# Patient Record
Sex: Male | Born: 1985
Health system: Southern US, Community
[De-identification: ages and names within clinical notes are randomized; demographics above are authoritative.]

## PROBLEM LIST (undated history)

## (undated) DIAGNOSIS — E785 Hyperlipidemia, unspecified: Secondary | ICD-10-CM

## (undated) DIAGNOSIS — I493 Ventricular premature depolarization: Principal | ICD-10-CM

## (undated) HISTORY — DX: Ventricular premature depolarization: I49.3

## (undated) HISTORY — DX: Hyperlipidemia, unspecified: E78.5

## (undated) HISTORY — PX: WISDOM TOOTH EXTRACTION: SHX21

---

## 2016-04-21 DIAGNOSIS — R0609 Other forms of dyspnea: Secondary | ICD-10-CM | POA: Diagnosis not present

## 2017-04-09 DIAGNOSIS — E663 Overweight: Secondary | ICD-10-CM | POA: Diagnosis not present

## 2017-04-09 DIAGNOSIS — E785 Hyperlipidemia, unspecified: Secondary | ICD-10-CM | POA: Diagnosis not present

## 2017-04-09 DIAGNOSIS — I493 Ventricular premature depolarization: Secondary | ICD-10-CM | POA: Diagnosis not present

## 2017-08-07 DIAGNOSIS — K529 Noninfective gastroenteritis and colitis, unspecified: Secondary | ICD-10-CM | POA: Diagnosis not present

## 2017-08-07 DIAGNOSIS — R197 Diarrhea, unspecified: Secondary | ICD-10-CM | POA: Diagnosis not present

## 2017-08-07 DIAGNOSIS — R112 Nausea with vomiting, unspecified: Secondary | ICD-10-CM | POA: Diagnosis not present

## 2018-04-14 ENCOUNTER — Telehealth: Payer: Self-pay | Admitting: Cardiology

## 2018-04-19 NOTE — Telephone Encounter (Signed)
done

## 2018-05-07 DIAGNOSIS — S39012A Strain of muscle, fascia and tendon of lower back, initial encounter: Secondary | ICD-10-CM | POA: Diagnosis not present

## 2018-05-11 DIAGNOSIS — S39012A Strain of muscle, fascia and tendon of lower back, initial encounter: Secondary | ICD-10-CM | POA: Diagnosis not present

## 2018-05-20 DIAGNOSIS — M5386 Other specified dorsopathies, lumbar region: Secondary | ICD-10-CM | POA: Diagnosis not present

## 2018-05-20 DIAGNOSIS — S39012A Strain of muscle, fascia and tendon of lower back, initial encounter: Secondary | ICD-10-CM | POA: Diagnosis not present

## 2018-05-24 DIAGNOSIS — S39012A Strain of muscle, fascia and tendon of lower back, initial encounter: Secondary | ICD-10-CM | POA: Diagnosis not present

## 2018-05-25 NOTE — Progress Notes (Signed)
Cardiology Office Note:    Date:  05/26/2018   ID:  Jeffery Boyd, DOB [birth year], MRN [id number]  PCP:  Othella Boyerilley, William S, MD  Cardiologist:  Norman HerrlichBrian Emry Barbato, MD    Referring MD: Othella Boyerilley, William S, MD    ASSESSMENT:    1. Ventricular premature depolarization    PLAN:    In order of problems listed above:  1. He continues to have symptomatic palpitation off and on beta-blocker but is concerning here as he has exercise intolerance and sensation of heart racing with physical activity at times these individuals if structurally normal heart can have catecholamine sensitive or exercise-induced ventricular tachycardia and we will set up a stress echocardiogram also with his abnormal EKG with right axis deviation to assess for structural heart disease.  If he has exercise-induced ventricular tachycardia would benefit from referral to EP and consideration of ablation of VT focus.  Continue beta-blocker given instructions avoid over-the-counter proarrhythmic drugs.   Next appointment: 6 months   Medication Adjustments/Labs and Tests Ordered: Current medicines are reviewed at length with the patient today.  Concerns regarding medicines are outlined above.  Orders Placed This Encounter  Procedures  . EKG 12-Lead  . ECHOCARDIOGRAM STRESS TEST   Meds ordered this encounter  Medications  . atenolol (TENORMIN) 25 MG tablet    Sig: Take 1 tablet (25 mg total) by mouth daily.    Dispense:  90 tablet    Refill:  3    Chief Complaint  Patient presents with  . Follow-up    for PVC's    History of Present Illness:    Jeffery Boyd is a 32 y.o. male with a hx of exercise intolerance palpitation rapid heart rhythm and symptomatic PVCs last seen 6 months ago Dr. Donnie Ahoilley. Compliance with diet, lifestyle and medications: Yes  Off-and-on he stops his beta-blocker he had a flare of symptoms resumed it a few months ago still has occasional typical PVCs because forceful contraction but when he tries  to do sports his heart races away he has to stop his physical effort at times he feels like he may faint.  He is at risk for catecholamine sensitive or exercise-induced ventricular tachycardia and his EKG shows right axis deviation I asked him to set up a stress echo to assess for structural heart disease and exercise-induced arrhythmia will continue his beta-blocker for now. Past Medical History:  Diagnosis Date  . Hyperlipidemia 05/26/2018  . Ventricular premature depolarization 05/26/2018    Past Surgical History:  Procedure Laterality Date  . WISDOM TOOTH EXTRACTION      Current Medications: No outpatient medications have been marked as taking for the 05/26/18 encounter (Office Visit) with Baldo DaubMunley, Jone Panebianco J, MD.     Allergies:   Patient has no known allergies.   Social History   Socioeconomic History  . Marital status: Married    Spouse name: Not on file  . Number of children: Not on file  . Years of education: Not on file  . Highest education level: Not on file  Occupational History  . Not on file  Social Needs  . Financial resource strain: Not on file  . Food insecurity:    Worry: Not on file    Inability: Not on file  . Transportation needs:    Medical: Not on file    Non-medical: Not on file  Tobacco Use  . Smoking status: Never Smoker  . Smokeless tobacco: Never Used  Substance and Sexual Activity  . Alcohol use: Yes  Comment: occ  . Drug use: Not Currently  . Sexual activity: Not on file  Lifestyle  . Physical activity:    Days per week: Not on file    Minutes per session: Not on file  . Stress: Not on file  Relationships  . Social connections:    Talks on phone: Not on file    Gets together: Not on file    Attends religious service: Not on file    Active member of club or organization: Not on file    Attends meetings of clubs or organizations: Not on file    Relationship status: Not on file  Other Topics Concern  . Not on file  Social History  Narrative  . Not on file     Family History: The patient's family history includes Heart attack (age of onset: 49) in his paternal uncle; Hyperlipidemia in his father; Hypertension in his father. ROS:   Please see the history of present illness.    All other systems reviewed and are negative.  EKGs/Labs/Other Studies Reviewed:    The following studies were reviewed today:  EKG:  EKG ordered today.  The ekg ordered today demonstrates [facility] and RAD  Recent Labs: No results found for requested labs within last 8760 hours.  Recent Lipid Panel No results found for: CHOL, TRIG, HDL, CHOLHDL, VLDL, LDLCALC, LDLDIRECT  Physical Exam:    VS:  BP 102/66 (BP Location: Left Arm, Patient Position: Sitting, Cuff Size: Normal)   Pulse (!) 54   Ht 5' 6.75" (1.695 m)   Wt 178 lb 1.9 oz (80.8 kg)   SpO2 98%   BMI 28.11 kg/m     Wt Readings from Last 3 Encounters:  05/26/18 178 lb 1.9 oz (80.8 kg)     GEN:  Well nourished, well developed in no acute distress HEENT: Normal NECK: No JVD; No carotid bruits LYMPHATICS: No lymphadenopathy CARDIAC: RRR, no murmurs, rubs, gallops RESPIRATORY:  Clear to auscultation without rales, wheezing or rhonchi  ABDOMEN: Soft, non-tender, non-distended MUSCULOSKELETAL:  No edema; No deformity  SKIN: Warm and dry NEUROLOGIC:  Alert and oriented x 3 PSYCHIATRIC:  Normal affect    Signed, Norman Herrlich, MD  05/26/2018 5:03 PM    [facility]

## 2018-05-26 ENCOUNTER — Ambulatory Visit (INDEPENDENT_AMBULATORY_CARE_PROVIDER_SITE_OTHER): Payer: BLUE CROSS/BLUE SHIELD | Admitting: Cardiology

## 2018-05-26 VITALS — BP 102/66 | HR 54 | Ht 66.75 in | Wt 178.1 lb

## 2018-05-26 DIAGNOSIS — E785 Hyperlipidemia, unspecified: Secondary | ICD-10-CM | POA: Insufficient documentation

## 2018-05-26 DIAGNOSIS — I493 Ventricular premature depolarization: Secondary | ICD-10-CM | POA: Insufficient documentation

## 2018-05-26 HISTORY — DX: Hyperlipidemia, unspecified: E78.5

## 2018-05-26 HISTORY — DX: Ventricular premature depolarization: I49.3

## 2018-05-26 MED ORDER — ATENOLOL 25 MG PO TABS: 25.0000 mg | ORAL_TABLET | Freq: Every day | ORAL | 3 refills | Status: DC

## 2018-05-26 NOTE — Patient Instructions (Addendum)
Medication Instructions:  Your physician recommends that you continue on your current medications as directed. Please refer to the Current Medication list given to you today.  If you need a refill on your cardiac medications before your next appointment, please call your pharmacy.   Lab work: None  If you have labs (blood work) drawn today and your tests are completely normal, you will receive your results only by: Jeffery Boyd. MyChart Message (if you have MyChart) OR . A paper copy in the mail If you have any lab test that is abnormal or we need to change your treatment, we will call you to review the results.  Testing/Procedures: You had an EKG today.   Your physician has requested that you have a stress echocardiogram. For further information please visit [email or web address]. Please follow instruction sheet as given.  Follow-Up: At [facility], you and your health needs are our priority.  As part of our continuing mission to provide you with exceptional heart care, we have created designated Provider Care Teams.  These Care Teams include your primary Cardiologist (physician) and Advanced Practice Providers (APPs -  Physician Assistants and Nurse Practitioners) who all work together to provide you with the care you need, when you need it. You will need a follow up appointment in 1 years.  Please call our office 2 months in advance to schedule this appointment.       1. Avoid all over-the-counter antihistamines except Claritin/Loratadine and Zyrtec/Cetrizine. 2. Avoid all combination including cold sinus allergies flu decongestant and sleep medications 3. You can use Robitussin DM Mucinex and Mucinex DM for cough. 4. can use Tylenol aspirin ibuprofen and naproxen but no combinations such as sleep or sinus.   KardiaMobile [email or web address]        FDA-cleared, clinical grade mobile EKG monitor: Lourena SimmondsKardia is the most clinically-validated mobile EKG used by the  world's leading cardiac care medical professionals With Basic service, know instantly if your heart rhythm is normal or if atrial fibrillation is detected, and email the last single EKG recording to yourself or your doctor Premium service, available for purchase through the Kardia app for $9.99 per month or $99 per year, includes unlimited history and storage of your EKG recordings, a monthly EKG summary report to share with your doctor, along with the ability to track your blood pressure, activity and weight Includes one KardiaMobile phone clip FREE SHIPPING: Standard delivery 1-3 business days. Orders placed by 11:00am PST will ship that afternoon. Otherwise, will ship next business day. All orders ship via [employer] Priority Mail from [place], [place]      Exercise Stress Electrocardiogram An exercise stress electrocardiogram is a test to check how blood flows to your heart. It is done to find areas of poor blood flow. You will need to walk on a treadmill for this test. The electrocardiogram will record your heartbeat when you are at rest and when you are exercising. What happens before the procedure?  Do not have drinks with caffeine or foods with caffeine for 24 hours before the test, or as told by your doctor. This includes coffee, tea (even decaf tea), sodas, chocolate, and cocoa.  Follow your doctor's instructions about eating and drinking before the test.  Ask your doctor what medicines you should or should not take before the test. Take your medicines with water unless told by your doctor not to.  If you use an inhaler, bring it with you to the test.  Bring a snack to eat after the test.  Do not  smoke for 4 hours before the test.  Do not put lotions, powders, creams, or oils on your chest before the test.  Wear comfortable shoes and clothing. What happens during the procedure?  You will have patches put on your chest. Small areas of your chest may need to be shaved. Wires will be  connected to the patches.  Your heart rate will be watched while you are resting and while you are exercising.  You will walk on the treadmill. The treadmill will slowly get faster to raise your heart rate.  The test will take about 1-2 hours. What happens after the procedure?  Your heart rate and blood pressure will be watched after the test.  You may return to your normal diet, activities, and medicines or as told by your doctor. This information is not intended to replace advice given to you by your health care provider. Make sure you discuss any questions you have with your health care provider. Document Released: 11/19/2007 Document Revised: 01/30/2016 Document Reviewed: 02/07/2013 Elsevier Interactive Patient Education  [employer].

## 2018-05-27 DIAGNOSIS — S39012D Strain of muscle, fascia and tendon of lower back, subsequent encounter: Secondary | ICD-10-CM | POA: Diagnosis not present

## 2018-05-27 DIAGNOSIS — M5386 Other specified dorsopathies, lumbar region: Secondary | ICD-10-CM | POA: Diagnosis not present

## 2018-05-28 DIAGNOSIS — S39012A Strain of muscle, fascia and tendon of lower back, initial encounter: Secondary | ICD-10-CM | POA: Diagnosis not present

## 2018-05-28 DIAGNOSIS — M5386 Other specified dorsopathies, lumbar region: Secondary | ICD-10-CM | POA: Diagnosis not present

## 2018-05-31 DIAGNOSIS — M5386 Other specified dorsopathies, lumbar region: Secondary | ICD-10-CM | POA: Diagnosis not present

## 2018-05-31 DIAGNOSIS — S39012A Strain of muscle, fascia and tendon of lower back, initial encounter: Secondary | ICD-10-CM | POA: Diagnosis not present

## 2018-06-02 ENCOUNTER — Ambulatory Visit: Payer: BLUE CROSS/BLUE SHIELD | Admitting: Family Medicine

## 2018-06-02 ENCOUNTER — Encounter: Payer: Self-pay | Admitting: Family Medicine

## 2018-06-02 VITALS — BP 112/64 | HR 80 | Temp 97.7°F | Ht 66.0 in | Wt 174.5 lb

## 2018-06-02 DIAGNOSIS — Z0000 Encounter for general adult medical examination without abnormal findings: Secondary | ICD-10-CM

## 2018-06-02 DIAGNOSIS — Z23 Encounter for immunization: Secondary | ICD-10-CM

## 2018-06-02 NOTE — Patient Instructions (Addendum)
Give us 2-3 business days to get the results of your labs back.   Keep the diet clean and stay active.  Aim to do some physical exertion for 150 minutes per week. This is typically divided into 5 days per week, 30 minutes per day. The activity should be enough to get your heart rate up. Anything is better than nothing if you have time constraints.  Do monthly self testicular checks in the shower. You are feeling for lumps/bumps that don't belong. If you feel anything like this, let me know!  Let us know if you need anything. 

## 2018-06-02 NOTE — Addendum Note (Signed)
Addended by: Scharlene GlossEWING, Tineka Uriegas B on: 06/02/2018 04:02 PM   Modules accepted: Orders

## 2018-06-02 NOTE — Progress Notes (Signed)
Chief Complaint  Patient presents with  . New Patient (Initial Visit)    [name] is here for a complete physical.   His last physical was >1 year ago.  Current diet: in general, a "fair, could be better" diet.   Current exercise: tries to walk Weight trend: stable overall Daytime fatigue? No. Seat belt? Yes.    Health maintenance Tetanus- Yes 12/15/2011 HIV- Yes 2005  Past Medical History:  Diagnosis Date  . Hyperlipidemia 05/26/2018  . Ventricular premature depolarization 05/26/2018   On Atenolol     Past Surgical History:  Procedure Laterality Date  . WISDOM TOOTH EXTRACTION     Medications  Current Outpatient Medications on File Prior to Visit  Medication Sig Dispense Refill  . atenolol (TENORMIN) 25 MG tablet Take 1 tablet (25 mg total) by mouth daily. 90 tablet 3   Allergies No Known Allergies  Family History Family History  Problem Relation Age of Onset  . Hypertension Father   . Hyperlipidemia Father   . Heart attack Paternal Uncle 40  . Asthma Mother     Review of Systems: Constitutional: no fevers or chills Eye:  no recent significant change in vision Ear/Nose/Mouth/Throat:  Ears:  no tinnitus or hearing loss Nose/Mouth/Throat:  no complaints of nasal congestion, no sore throat Cardiovascular:  no chest pain, no palpitations Respiratory:  no cough and no shortness of breath Gastrointestinal:  no abdominal pain, no change in bowel habits GU:  Male: negative for dysuria, frequency, and incontinence and negative for prostate symptoms Musculoskeletal/Extremities: +LBP; otherwise no pain, redness, or swelling of the joints Integumentary (Skin/Breast):  no abnormal skin lesions reported Neurologic:  no headaches, no numbness, tingling Endocrine: No unexpected weight changes Hematologic/Lymphatic:  no night sweats  Exam BP 112/64 (BP Location: Left Arm, Patient Position: Sitting, Cuff Size: Normal)   Pulse 80   Temp 97.7 F (36.5 C)  (Oral)   Ht 5\' 6"  (1.676 m)   Wt 174 lb 8 oz (79.2 kg)   SpO2 98%   BMI 28.17 kg/m  General:  well developed, well nourished, in no apparent distress Skin:  no significant moles, warts, or growths Head:  no masses, lesions, or tenderness Eyes:  pupils equal and round, sclera anicteric without injection Ears:  canals without lesions, TMs shiny without retraction, no obvious effusion, no erythema Nose:  nares patent, septum midline, mucosa normal Throat/Pharynx:  lips and gingiva without lesion; tongue and uvula midline; non-inflamed pharynx; no exudates or postnasal drainage Neck: neck supple without adenopathy, thyromegaly, or masses Lungs:  clear to auscultation, breath sounds equal bilaterally, no respiratory distress Cardio:  regular rate and rhythm, no bruits, no LE edema Abdomen:  abdomen soft, nontender; bowel sounds normal; no masses or organomegaly Genital (male): Uncircumcised penis, no lesions or discharge; testes present bilaterally without masses or tenderness Rectal: Deferred Musculoskeletal:  symmetrical muscle groups noted without atrophy or deformity Extremities:  no clubbing, cyanosis, or edema, no deformities, no skin discoloration Neuro:  gait normal; deep tendon reflexes normal and symmetric Psych: well oriented with normal range of affect and appropriate judgment/insight  Assessment and Plan  Well adult exam - Plan: Comprehensive metabolic panel, Lipid panel   Well 32 y.o. male. Counseled on diet and exercise. Other orders as above. Follow up in 1 year pending the above workup. The patient voiced understanding and agreement to the plan.  Jeffery Rocheicholas Paul McGovernWendling, DO 06/02/18 3:24 PM

## 2018-06-02 NOTE — Progress Notes (Signed)
Pre visit review using our clinic review tool, if applicable. No additional management support is needed unless otherwise documented below in the visit note. 

## 2018-06-03 DIAGNOSIS — M5386 Other specified dorsopathies, lumbar region: Secondary | ICD-10-CM | POA: Diagnosis not present

## 2018-06-03 DIAGNOSIS — S39012D Strain of muscle, fascia and tendon of lower back, subsequent encounter: Secondary | ICD-10-CM | POA: Diagnosis not present

## 2018-06-07 ENCOUNTER — Ambulatory Visit (HOSPITAL_BASED_OUTPATIENT_CLINIC_OR_DEPARTMENT_OTHER)
Admission: RE | Admit: 2018-06-07 | Discharge: 2018-06-07 | Disposition: A | Discharge status: Home / Self Care | Payer: BLUE CROSS/BLUE SHIELD | Source: Ambulatory Visit | Attending: Cardiology | Admitting: Cardiology

## 2018-06-07 ENCOUNTER — Other Ambulatory Visit: Payer: Self-pay | Admitting: Family Medicine

## 2018-06-07 ENCOUNTER — Other Ambulatory Visit (INDEPENDENT_AMBULATORY_CARE_PROVIDER_SITE_OTHER): Payer: BLUE CROSS/BLUE SHIELD

## 2018-06-07 DIAGNOSIS — I493 Ventricular premature depolarization: Secondary | ICD-10-CM | POA: Diagnosis not present

## 2018-06-07 DIAGNOSIS — Z0000 Encounter for general adult medical examination without abnormal findings: Secondary | ICD-10-CM | POA: Diagnosis not present

## 2018-06-07 DIAGNOSIS — S39012A Strain of muscle, fascia and tendon of lower back, initial encounter: Secondary | ICD-10-CM | POA: Diagnosis not present

## 2018-06-07 DIAGNOSIS — E7849 Other hyperlipidemia: Secondary | ICD-10-CM

## 2018-06-07 DIAGNOSIS — M5386 Other specified dorsopathies, lumbar region: Secondary | ICD-10-CM | POA: Diagnosis not present

## 2018-06-07 LAB — COMPREHENSIVE METABOLIC PANEL
ALT: 18 U/L (ref 0–53)
AST: 17 U/L (ref 0–37)
Albumin: 4.5 g/dL (ref 3.5–5.2)
Alkaline Phosphatase: 45 U/L (ref 39–117)
BUN: 17 mg/dL (ref 6–23)
CO2: 30 mEq/L (ref 19–32)
Calcium: 9.8 mg/dL (ref 8.4–10.5)
Chloride: 102 mEq/L (ref 96–112)
Creatinine, Ser: 1.35 mg/dL (ref 0.40–1.50)
GFR: 64.92 mL/min (ref >60.00)
Glucose, Bld: 86 mg/dL (ref 70–99)
Potassium: 4.1 mEq/L (ref 3.5–5.1)
Sodium: 138 mEq/L (ref 135–145)
Total Bilirubin: 0.6 mg/dL (ref 0.2–1.2)
Total Protein: 6.5 g/dL (ref 6.0–8.3)

## 2018-06-07 LAB — LIPID PANEL
Cholesterol: 245 mg/dL — ABNORMAL HIGH (ref 0–200)
HDL: 45.4 mg/dL (ref >39.00)
LDL Cholesterol: 179 mg/dL — ABNORMAL HIGH (ref 0–99)
NonHDL: 199.62
Total CHOL/HDL Ratio: 5
Triglycerides: 101 mg/dL (ref 0.0–149.0)
VLDL: 20.2 mg/dL (ref 0.0–40.0)

## 2018-06-07 NOTE — Progress Notes (Signed)
  Echocardiogram Echocardiogram Stress Test has been performed.  Alcides Nutting T Girl Schissler 06/07/2018, 11:04 AM

## 2018-06-10 DIAGNOSIS — S39012D Strain of muscle, fascia and tendon of lower back, subsequent encounter: Secondary | ICD-10-CM | POA: Diagnosis not present

## 2018-06-15 DIAGNOSIS — S39012D Strain of muscle, fascia and tendon of lower back, subsequent encounter: Secondary | ICD-10-CM | POA: Diagnosis not present

## 2018-06-17 DIAGNOSIS — S39012A Strain of muscle, fascia and tendon of lower back, initial encounter: Secondary | ICD-10-CM | POA: Diagnosis not present

## 2018-06-22 DIAGNOSIS — S39012D Strain of muscle, fascia and tendon of lower back, subsequent encounter: Secondary | ICD-10-CM | POA: Diagnosis not present

## 2018-06-28 DIAGNOSIS — M5386 Other specified dorsopathies, lumbar region: Secondary | ICD-10-CM | POA: Diagnosis not present

## 2018-06-28 DIAGNOSIS — S39012D Strain of muscle, fascia and tendon of lower back, subsequent encounter: Secondary | ICD-10-CM | POA: Diagnosis not present

## 2018-07-06 DIAGNOSIS — M5386 Other specified dorsopathies, lumbar region: Secondary | ICD-10-CM | POA: Diagnosis not present

## 2018-07-06 DIAGNOSIS — S39012A Strain of muscle, fascia and tendon of lower back, initial encounter: Secondary | ICD-10-CM | POA: Diagnosis not present

## 2018-07-13 DIAGNOSIS — S39012D Strain of muscle, fascia and tendon of lower back, subsequent encounter: Secondary | ICD-10-CM | POA: Diagnosis not present

## 2018-07-22 DIAGNOSIS — S39012D Strain of muscle, fascia and tendon of lower back, subsequent encounter: Secondary | ICD-10-CM | POA: Diagnosis not present

## 2018-08-05 DIAGNOSIS — S39012D Strain of muscle, fascia and tendon of lower back, subsequent encounter: Secondary | ICD-10-CM | POA: Diagnosis not present

## 2018-09-06 ENCOUNTER — Other Ambulatory Visit: Payer: BLUE CROSS/BLUE SHIELD

## 2019-06-07 ENCOUNTER — Other Ambulatory Visit: Payer: Self-pay

## 2019-06-08 ENCOUNTER — Ambulatory Visit (INDEPENDENT_AMBULATORY_CARE_PROVIDER_SITE_OTHER): Payer: BC Managed Care – PPO | Admitting: Family Medicine

## 2019-06-08 ENCOUNTER — Encounter: Payer: Self-pay | Admitting: Family Medicine

## 2019-06-08 ENCOUNTER — Other Ambulatory Visit: Payer: Self-pay | Admitting: Family Medicine

## 2019-06-08 ENCOUNTER — Other Ambulatory Visit: Payer: Self-pay

## 2019-06-08 VITALS — BP 102/62 | HR 73 | Temp 96.4°F | Ht 66.5 in | Wt 166.0 lb

## 2019-06-08 DIAGNOSIS — E7849 Other hyperlipidemia: Secondary | ICD-10-CM

## 2019-06-08 DIAGNOSIS — Z0000 Encounter for general adult medical examination without abnormal findings: Secondary | ICD-10-CM

## 2019-06-08 LAB — LIPID PANEL
Cholesterol: 227 mg/dL — ABNORMAL HIGH (ref 0–200)
HDL: 47.2 mg/dL (ref >39.00)
LDL Cholesterol: 155 mg/dL — ABNORMAL HIGH (ref 0–99)
NonHDL: 179.66
Total CHOL/HDL Ratio: 5
Triglycerides: 123 mg/dL (ref 0.0–149.0)
VLDL: 24.6 mg/dL (ref 0.0–40.0)

## 2019-06-08 LAB — COMPREHENSIVE METABOLIC PANEL
ALT: 16 U/L (ref 0–53)
AST: 23 U/L (ref 0–37)
Albumin: 4.6 g/dL (ref 3.5–5.2)
Alkaline Phosphatase: 57 U/L (ref 39–117)
BUN: 18 mg/dL (ref 6–23)
CO2: 26 mEq/L (ref 19–32)
Calcium: 9.7 mg/dL (ref 8.4–10.5)
Chloride: 102 mEq/L (ref 96–112)
Creatinine, Ser: 1.22 mg/dL (ref 0.40–1.50)
GFR: 68.23 mL/min (ref >60.00)
Glucose, Bld: 89 mg/dL (ref 70–99)
Potassium: 3.6 mEq/L (ref 3.5–5.1)
Sodium: 138 mEq/L (ref 135–145)
Total Bilirubin: 0.5 mg/dL (ref 0.2–1.2)
Total Protein: 6.8 g/dL (ref 6.0–8.3)

## 2019-06-08 LAB — CBC
HCT: 44.2 % (ref 39.0–52.0)
Hemoglobin: 15.1 g/dL (ref 13.0–17.0)
MCHC: 34.2 g/dL (ref 30.0–36.0)
MCV: 87 fl (ref 78.0–100.0)
Platelets: 232 10*3/uL (ref 150.0–400.0)
RBC: 5.08 Mil/uL (ref 4.22–5.81)
RDW: 13 % (ref 11.5–15.5)
WBC: 6.7 10*3/uL (ref 4.0–10.5)

## 2019-06-08 NOTE — Patient Instructions (Addendum)
Give us 2-3 business days to get the results of your labs back.   Keep the diet clean and stay active.  Do monthly self testicular checks in the shower. You are feeling for lumps/bumps that don't belong. If you feel anything like this, let me know!  Let us know if you need anything. 

## 2019-06-08 NOTE — Progress Notes (Signed)
Chief Complaint  Patient presents with  . Annual Exam    [name] is here for a complete physical.   His last physical was >1 year ago.  Current diet: in general, a "healthy" diet.   Current exercise: running, in home workouts Weight trend: has intentionally lost Daytime fatigue? No. Seat belt? Yes.    Health maintenance Tetanus- Yes  HIV- Yes  Past Medical History:  Diagnosis Date  . Hyperlipidemia 05/26/2018  . Ventricular premature depolarization 05/26/2018   On Atenolol     Past Surgical History:  Procedure Laterality Date  . WISDOM TOOTH EXTRACTION     Medications  Current Outpatient Medications on File Prior to Visit  Medication Sig Dispense Refill  . atenolol (TENORMIN) 25 MG tablet Take 1 tablet (25 mg total) by mouth daily. 90 tablet 3   Allergies No Known Allergies  Family History Family History  Problem Relation Age of Onset  . Hypertension Father   . Hyperlipidemia Father   . Heart attack Paternal Uncle 31  . Asthma Mother     Review of Systems: Constitutional: no fevers or chills Eye:  no recent significant change in vision Ear/Nose/Mouth/Throat:  Ears:  no hearing loss Nose/Mouth/Throat:  no complaints of nasal congestion, no sore throat Cardiovascular:  no chest pain Respiratory:  no shortness of breath Gastrointestinal:  no abdominal pain, no change in bowel habits GU:  Male: negative for dysuria, frequency, and incontinence Musculoskeletal/Extremities:  no pain of the joints Integumentary (Skin/Breast):  no abnormal skin lesions reported Neurologic:  no headaches Endocrine: No unexpected weight changes Hematologic/Lymphatic:  no night sweats  Exam BP 102/62 (BP Location: Left Arm, Patient Position: Sitting, Cuff Size: Normal)   Pulse 73   Temp (!) 96.4 F (35.8 C) (Temporal)   Ht 5' 6.5" (1.689 m)   Wt 166 lb (75.3 kg)   SpO2 97%   BMI 26.39 kg/m  General:  well developed, well nourished, in no apparent  distress Skin:  no significant moles, warts, or growths Head:  no masses, lesions, or tenderness Eyes:  pupils equal and round, sclera anicteric without injection Ears:  canals without lesions, TMs shiny without retraction, no obvious effusion, no erythema Nose:  nares patent, septum midline, mucosa normal Throat/Pharynx:  lips and gingiva without lesion; tongue and uvula midline; non-inflamed pharynx; no exudates or postnasal drainage Neck: neck supple without adenopathy, thyromegaly, or masses Lungs:  clear to auscultation, breath sounds equal bilaterally, no respiratory distress Cardio:  regular rate and rhythm, no bruits, no LE edema Abdomen:  abdomen soft, nontender; bowel sounds normal; no masses or organomegaly Genital (male): circumcised penis, no lesions or discharge; testes present bilaterally without masses or tenderness Rectal: Deferred Musculoskeletal:  symmetrical muscle groups noted without atrophy or deformity Extremities:  no clubbing, cyanosis, or edema, no deformities, no skin discoloration Neuro:  gait normal; deep tendon reflexes normal and symmetric Psych: well oriented with normal range of affect and appropriate judgment/insight  Assessment and Plan  Well adult exam - Plan: CBC, Comp Met (CMET), Lipid Profile   Well 33 y.o. male. Counseled on diet and exercise. Self testicular exams recommended at least monthly.  Other orders as above. Follow up in 1 year pending the above workup. The patient voiced understanding and agreement to the plan.  [place], DO 06/08/19 8:39 AM

## 2019-06-23 ENCOUNTER — Other Ambulatory Visit: Payer: Self-pay

## 2019-06-23 ENCOUNTER — Ambulatory Visit (INDEPENDENT_AMBULATORY_CARE_PROVIDER_SITE_OTHER): Payer: BC Managed Care – PPO | Admitting: Cardiology

## 2019-06-23 VITALS — HR 44 | Ht 67.2 in | Wt 166.0 lb

## 2019-06-23 DIAGNOSIS — E782 Mixed hyperlipidemia: Secondary | ICD-10-CM | POA: Diagnosis not present

## 2019-06-23 DIAGNOSIS — I493 Ventricular premature depolarization: Secondary | ICD-10-CM

## 2019-06-23 MED ORDER — ATENOLOL 25 MG PO TABS: ORAL_TABLET | ORAL | 3 refills | Status: DC

## 2019-06-23 NOTE — Patient Instructions (Addendum)
Medication Instructions:  Your physician has recommended you make the following change in your medication:   DECREASE : Atenolol 25 mg to every other day. If tolerated then decrease to 2 times a week x 1 month then daily as needed  *If you need a refill on your cardiac medications before your next appointment, please call your pharmacy*  Lab Work: None If you have labs (blood work) drawn today and your tests are completely normal, you will receive your results only by: Marland Kitchen MyChart Message (if you have MyChart) OR . A paper copy in the mail If you have any lab test that is abnormal or we need to change your treatment, we will call you to review the results.  Testing/Procedures: None  Follow-Up: At [facility], you and your health needs are our priority.  As part of our continuing mission to provide you with exceptional heart care, we have created designated Provider Care Teams.  These Care Teams include your primary Cardiologist (physician) and Advanced Practice Providers (APPs -  Physician Assistants and Nurse Practitioners) who all work together to provide you with the care you need, when you need it.  Your next appointment:   1 year(s)  The format for your next appointment:   In Person  Provider:   Norman Herrlich, MD  Other Instructions  1. Avoid all over-the-counter antihistamines except Claritin/Loratadine and Zyrtec/Cetrizine. 2. Avoid all combination including cold sinus allergies flu decongestant and sleep medications 3. You can use Robitussin DM Mucinex and Mucinex DM for cough. 4. can use Tylenol aspirin ibuprofen and naproxen but no combinations such as sleep or sinus.

## 2019-06-23 NOTE — Progress Notes (Signed)
Cardiology Office Note:    Date:  06/23/2019   ID:  Jeffery Boyd, DOB 02/05/1986, MRN [id number]  PCP:  Shelda Pal, DO  Cardiologist:  Shirlee More, MD    Referring MD: Shelda Pal*    ASSESSMENT:    1. Ventricular premature depolarization   2. Mixed hyperlipidemia    PLAN:    In order of problems listed above:  1. Improved with lifestyle changes I encouraged him to decrease his beta-blocker every other day for a month twice a week for a month and if successful take as needed he is not having symptoms he misses days occasionally without adverse effect no EKG PVCs today.  He will continue to avoid over-the-counter proarrhythmic drugs 2. His LDL is improved in the last year lifestyle changes he remains at a low risk group and I asked him to consider in the future post Covid having a coronary calcium score if severely elevated benefit from a statin otherwise at this time I think I would defer   Next appointment: 1 year   Medication Adjustments/Labs and Tests Ordered: Current medicines are reviewed at length with the patient today.  Concerns regarding medicines are outlined above.  Orders Placed This Encounter  Procedures  . EKG 12-Lead   Meds ordered this encounter  Medications  . atenolol (TENORMIN) 25 MG tablet    Sig: Take 1 tab every other day x 1 month then twice a week x 1 month then daily as needed    Dispense:  90 tablet    Refill:  3    Chief Complaint  Patient presents with  . Follow-up    For PVCs    History of Present Illness:    Tracer Jeffery Boyd is a 34 y.o. male with a hx of symptomatic PVC's last seen 05/26/2018. Compliance with diet, lifestyle and medications: Yes  Stress echo 06/07/2018: Study Conclusions - Stress ECG conclusions: There were no stress arrhythmias or   conduction abnormalities. The stress ECG was negative for   ischemia. - Staged echo: There was no echocardiographic evidence for   stress-induced  ischemia. Impressions: - 1. Normal stress echo.   2. Excellent exercise capacity. There were no stress arrhythmias or conduction abnormalities   3, Wall motion and valves appeared unremarkable on baseline   images.  He has had a good year he has lost 25 pounds exercises and feels markedly improved palpitation is resolved.  No chest pain edema shortness of breath.  Occasionally misses his beta-blocker without any adverse effect and after discussion we will begin to withdrawal and see if he can take in the future as needed.  He asked my advice about statin therapy see above Past Medical History:  Diagnosis Date  . Hyperlipidemia 05/26/2018  . 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 WISDOM TOOTH EXTRACTION      Current Medications: Current Meds  Medication Sig  . atenolol (TENORMIN) 25 MG tablet Take 1 tab every other day x 1 month then twice a week x 1 month then daily as needed  . [DISCONTINUED] atenolol (TENORMIN) 25 MG tablet Take 1 tablet (25 mg total) by mouth daily.     Allergies:   Patient has no known allergies.   Social History   Socioeconomic History  . Marital status: Married    Spouse name: Not on file  . Number of children: Not on file  . Years of education: Not on  file  . Highest education level: Not on file  Occupational History  . Not on file  Tobacco Use  . Smoking status: Never Smoker  . Smokeless tobacco: Never Used  Substance and Sexual Activity  . Alcohol use: Yes    Comment: occ  . Drug use: Not Currently  . Sexual activity: Not on file  Other Topics Concern  . Not on file  Social History Narrative  . Not on file   Social Determinants of Health   Financial Resource Strain:   . Difficulty of Paying Living Expenses: Not on file  Food Insecurity:   . Worried About Programme researcher, broadcasting/film/video in the Last Year: Not on file  . Ran Out of Food in the Last Year: Not on file   Transportation Needs:   . Lack of Transportation (Medical): Not on file  . Lack of Transportation (Non-Medical): Not on file  Physical Activity:   . Days of Exercise per Week: Not on file  . Minutes of Exercise per Session: Not on file  Stress:   . Feeling of Stress : Not on file  Social Connections:   . Frequency of Communication with Friends and Family: Not on file  . Frequency of Social Gatherings with Friends and Family: Not on file  . Attends Religious Services: Not on file  . Active Member of Clubs or Organizations: Not on file  . Attends Banker Meetings: Not on file  . Marital Status: Not on file     Family History: The patient's family history includes Asthma in his mother; Heart attack (age of onset: 43) in his paternal uncle; Hyperlipidemia in his father; Hypertension in his father. ROS:   Please see the history of present illness.    All other systems reviewed and are negative.  EKGs/Labs/Other Studies Reviewed:    The following studies were reviewed today:  EKG:  EKG ordered today and personally reviewed.  The ekg ordered today demonstrates sinus bradycardia 44 bpm otherwise normal  Recent Labs: 06/08/2019: ALT 16; BUN 18; Creatinine, Ser 1.22; Hemoglobin 15.1; Platelets 232.0; Potassium 3.6; Sodium 138  Recent Lipid Panel    Component Value Date/Time   CHOL 227 (H) 06/08/2019 0821   TRIG 123.0 06/08/2019 0821   HDL 47.20 06/08/2019 0821   CHOLHDL 5 06/08/2019 0821   VLDL 24.6 06/08/2019 0821   LDLCALC 155 (H) 06/08/2019 0821    Physical Exam:    VS:  Pulse (!) 44   Ht 5' 7.2" (1.707 m)   Wt 166 lb (75.3 kg)   BMI 25.84 kg/m     Wt Readings from Last 3 Encounters:  06/23/19 166 lb (75.3 kg)  06/08/19 166 lb (75.3 kg)  06/02/18 174 lb 8 oz (79.2 kg)     GEN:  Well nourished, well developed in no acute distress HEENT: Normal NECK: No JVD; No carotid bruits LYMPHATICS: No lymphadenopathy CARDIAC: RRR, no murmurs, rubs,  gallops RESPIRATORY:  Clear to auscultation without rales, wheezing or rhonchi  ABDOMEN: Soft, non-tender, non-distended MUSCULOSKELETAL:  No edema; No deformity  SKIN: Warm and dry NEUROLOGIC:  Alert and oriented x 3 PSYCHIATRIC:  Normal affect    Signed, Norman Herrlich, MD  06/23/2019 4:44 PM    [facility]

## 2019-07-13 DIAGNOSIS — Z20822 Contact with and (suspected) exposure to covid-19: Secondary | ICD-10-CM | POA: Diagnosis not present

## 2019-07-21 DIAGNOSIS — J029 Acute pharyngitis, unspecified: Secondary | ICD-10-CM | POA: Diagnosis not present

## 2019-07-21 DIAGNOSIS — H6691 Otitis media, unspecified, right ear: Secondary | ICD-10-CM | POA: Diagnosis not present

## 2019-07-28 ENCOUNTER — Ambulatory Visit (INDEPENDENT_AMBULATORY_CARE_PROVIDER_SITE_OTHER): Payer: BC Managed Care – PPO | Admitting: Medical

## 2019-07-28 ENCOUNTER — Other Ambulatory Visit: Payer: Self-pay

## 2019-07-28 VITALS — Temp 98.6°F | Wt 166.0 lb

## 2019-07-28 DIAGNOSIS — J029 Acute pharyngitis, unspecified: Secondary | ICD-10-CM

## 2019-07-28 DIAGNOSIS — R591 Generalized enlarged lymph nodes: Secondary | ICD-10-CM | POA: Diagnosis not present

## 2019-07-28 MED ORDER — AZITHROMYCIN 250 MG PO TABS: ORAL_TABLET | ORAL | 0 refills | Status: DC

## 2019-07-28 NOTE — Progress Notes (Signed)
   Subjective:    Patient ID: Jeffery Boyd, male    DOB: 1985-08-11, 34 y.o.   MRN: [id number]  HPI  Virtual Visit via Video Note  I connected with [name] on 07/28/19 at 11:20 AM EST by a video enabled telemedicine application and verified that I am speaking with the correct person using two identifiers.  Location: Patient: Home Provider: Office   I discussed the limitations of evaluation and management by telemedicine and the availability of in person appointments. The patient expressed understanding and agreed to proceed.  History of Present Illness:   [facility] and had rapid covid test January 27 and send out test came back on 29 th . Both came back negative.  Pt was given amoxicillin 500 mg bid x 10 days. Last dose this morning.  Pt states he had submandibular node tenderness on both sides. Rt side is worse but some on both sides. He also reported some some pnd.  Before st began he had some chills.   Last Wednesday on Feb 3rd had strep test and send out strep came back.   Pt states submandibilar nodes are still mild tender. No white dc reported on back of his throat.   Observations/Objective: General-no acute distress, pleasant, oriented. Lungs- on inspection lungs appear unlabored. Neck- no tracheal deviation or jvd on inspection. Neuro- gross motor function appears intact.  Assessment and Plan: It is reassuring that both your rapid Covid test and your send out test was negative.  In addition you report rapid strep negative as well.  Since you have finished antibiotics and you still have some tender submandibular nodes, I do think it is a good idea to switch you to a azithromycin which has some different bacterial type coverage.  Recommend to stay at home for the next 5 days while sleeping how you respond to azithromycin.  If your signs and symptoms change or worsen please let us know.  I do think is a good idea for you to follow-up  in 7 to 10 days so we can palpate your lymph node regions indeterminate CBC, Epstein-Barr panel or other studies indicated.  Would like you to have no pain and for your lymph node region to return to baseline.  As needed follow-up as well.  Follow Up Instructions:    I discussed the assessment and treatment plan with the patient. The patient was provided an opportunity to ask questions and all were answered. The patient agreed with the plan and demonstrated an understanding of the instructions.   The patient was advised to call back or seek an in-person evaluation if the symptoms worsen or if the condition fails to improve as anticipated.  I provided 20 minutes of non-face-to-face time during this encounter.   Esperanza Richters, PA-C   Review of Systems     Objective:   Physical Exam        Assessment & Plan:

## 2019-07-28 NOTE — Patient Instructions (Addendum)
It is reassuring that both your rapid Covid test and your send out test was negative.  In addition you report rapid strep negative as well.  Since you have finished antibiotics and you still have some tender submandibular nodes, I do think it is a good idea to switch you to a azithromycin which has some different bacterial type coverage.  Recommend to stay at home for the next 5 days while sleeping how you respond to azithromycin.  If your signs and symptoms change or worsen please let us know.  I do think is a good idea for you to follow-up in 7 to 10 days so we can palpate your lymph node regions indeterminate CBC, Epstein-Barr panel or other studies indicated.  Would like you to have no pain and for your lymph node region to return to baseline.  As needed follow-up as well.

## 2019-08-01 ENCOUNTER — Ambulatory Visit: Payer: BC Managed Care – PPO | Admitting: Medical

## 2019-08-01 ENCOUNTER — Telehealth: Payer: Self-pay | Admitting: Family Medicine

## 2019-08-01 ENCOUNTER — Other Ambulatory Visit: Payer: Self-pay

## 2019-08-01 ENCOUNTER — Ambulatory Visit (HOSPITAL_BASED_OUTPATIENT_CLINIC_OR_DEPARTMENT_OTHER)
Admission: RE | Admit: 2019-08-01 | Discharge: 2019-08-01 | Disposition: A | Discharge status: Home / Self Care | Payer: BC Managed Care – PPO | Source: Ambulatory Visit | Attending: Medical | Admitting: Medical

## 2019-08-01 ENCOUNTER — Encounter: Payer: Self-pay | Admitting: Medical

## 2019-08-01 VITALS — BP 107/60 | HR 56 | Temp 97.9°F | Resp 12 | Ht 66.5 in | Wt 169.0 lb

## 2019-08-01 DIAGNOSIS — R221 Localized swelling, mass and lump, neck: Secondary | ICD-10-CM | POA: Diagnosis not present

## 2019-08-01 DIAGNOSIS — M542 Cervicalgia: Secondary | ICD-10-CM

## 2019-08-01 DIAGNOSIS — R591 Generalized enlarged lymph nodes: Secondary | ICD-10-CM

## 2019-08-01 DIAGNOSIS — R5383 Other fatigue: Secondary | ICD-10-CM | POA: Diagnosis not present

## 2019-08-01 DIAGNOSIS — J029 Acute pharyngitis, unspecified: Secondary | ICD-10-CM | POA: Diagnosis not present

## 2019-08-01 DIAGNOSIS — R599 Enlarged lymph nodes, unspecified: Secondary | ICD-10-CM | POA: Diagnosis not present

## 2019-08-01 LAB — CBC WITH DIFFERENTIAL/PLATELET
Basophils Absolute: 0 10*3/uL (ref 0.0–0.1)
Basophils Relative: 0.5 % (ref 0.0–3.0)
Eosinophils Absolute: 0.1 10*3/uL (ref 0.0–0.7)
Eosinophils Relative: 1 % (ref 0.0–5.0)
HCT: 46 % (ref 39.0–52.0)
Hemoglobin: 15.8 g/dL (ref 13.0–17.0)
Lymphocytes Relative: 35.8 % (ref 12.0–46.0)
Lymphs Abs: 2.3 10*3/uL (ref 0.7–4.0)
MCHC: 34.3 g/dL (ref 30.0–36.0)
MCV: 87.6 fl (ref 78.0–100.0)
Monocytes Absolute: 0.5 10*3/uL (ref 0.1–1.0)
Monocytes Relative: 7.5 % (ref 3.0–12.0)
Neutro Abs: 3.5 10*3/uL (ref 1.4–7.7)
Neutrophils Relative %: 55.2 % (ref 43.0–77.0)
Platelets: 248 10*3/uL (ref 150.0–400.0)
RBC: 5.25 Mil/uL (ref 4.22–5.81)
RDW: 12.8 % (ref 11.5–15.5)
WBC: 6.4 10*3/uL (ref 4.0–10.5)

## 2019-08-01 IMAGING — US US SOFT TISSUE HEAD/NECK
1 series · 13 of 16 positions shown · non-contrast
Comparison: No pertinent prior studies available for comparison.

CLINICAL DATA: Lymphadenopathy. Neck pain. Right-sided lymph node
enlarged and tender submandibular no despite 2 rounds of
antibiotics. Additional history provided by scanning technologist:
Submandibular swelling (bilateral)

EXAM:
ULTRASOUND OF HEAD/NECK SOFT TISSUES
TECHNIQUE: Ultrasound examination of the head and neck soft tissues was
performed in the area of clinical concern.

[Series 1: us soft tissue head/neck · 16 acquisitions, 13 frames shown]
[im 1/16]
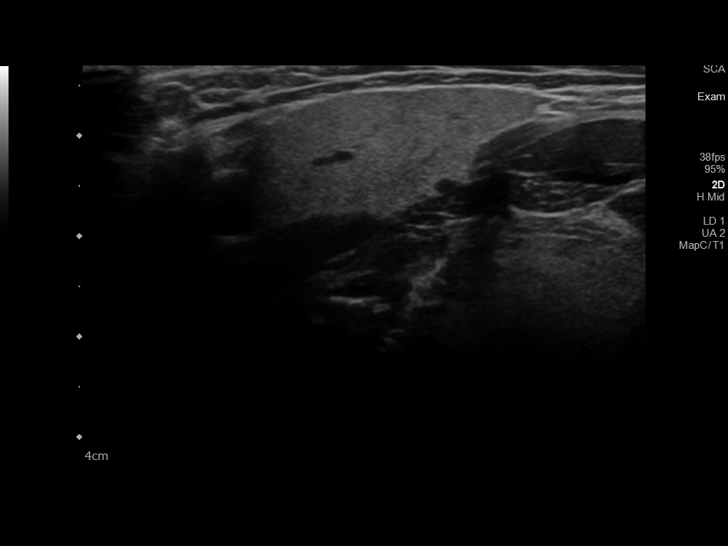
[im 2/16]
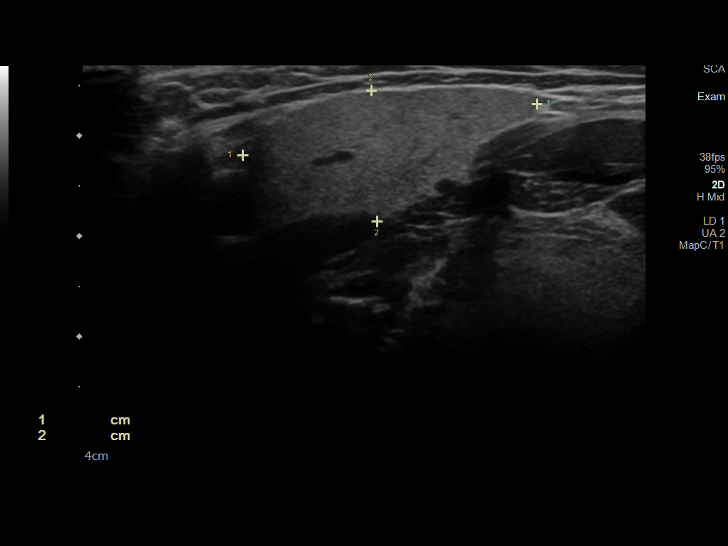
[im 4/16]
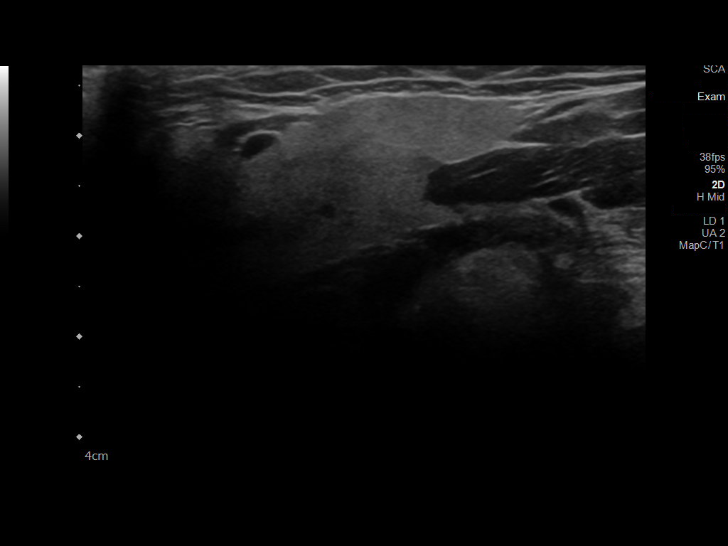
[im 5/16]
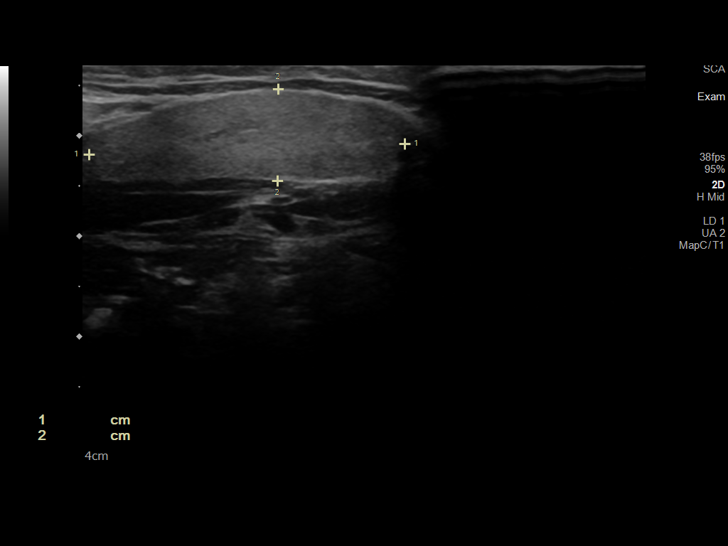
[im 6/16]
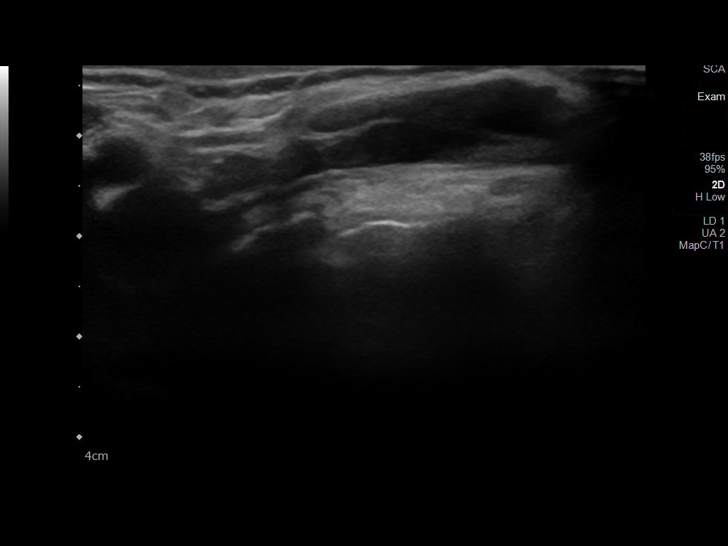
[im 7/16]
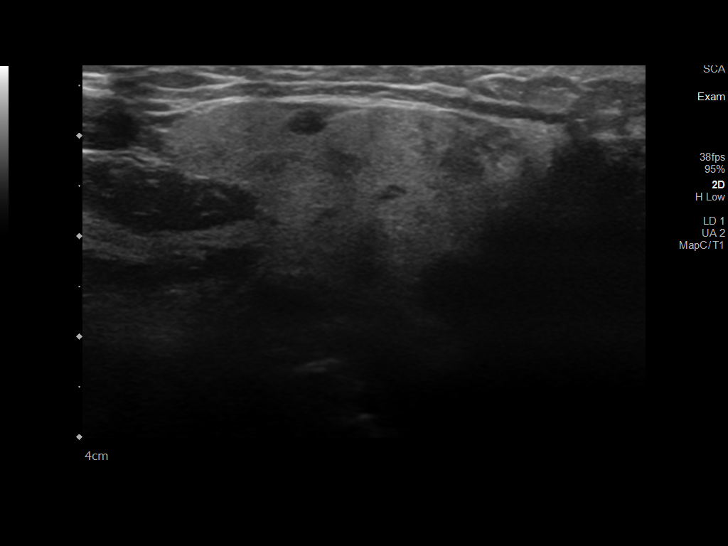
[im 9/16]
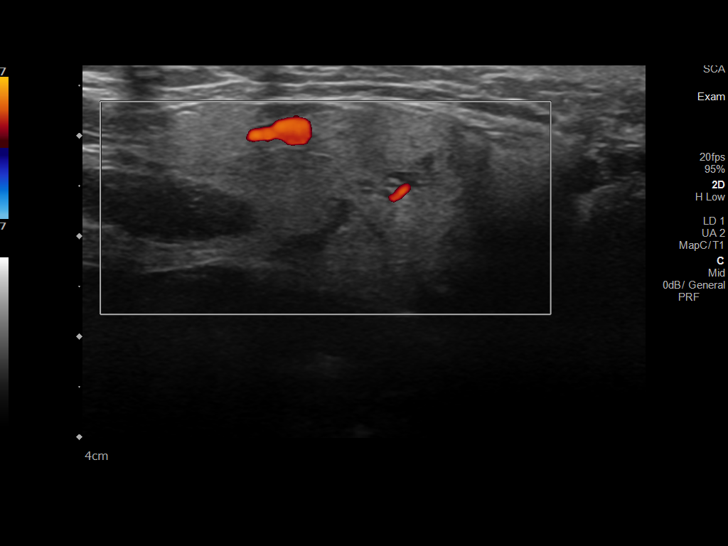
[im 10/16]
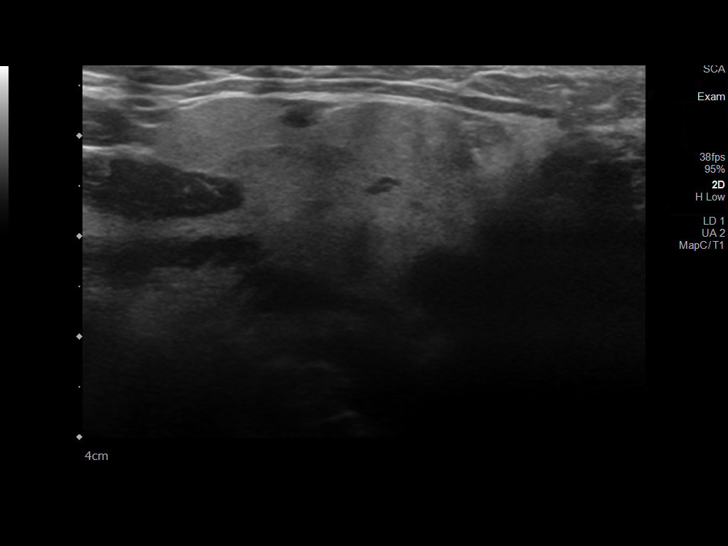
[im 11/16]
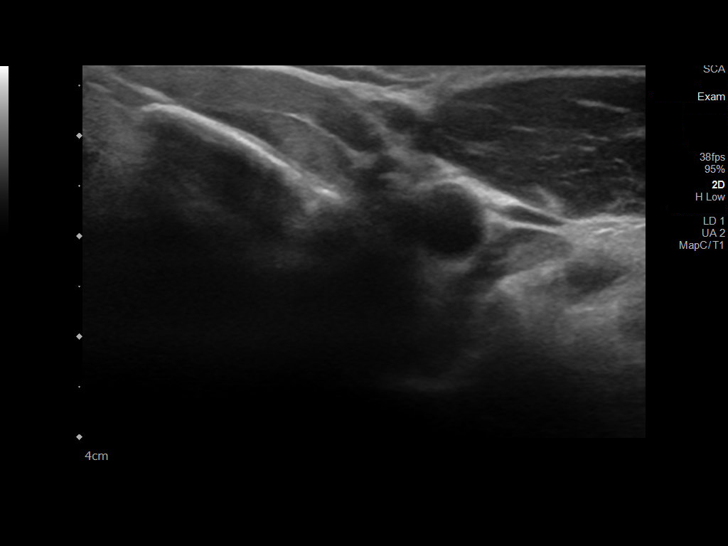
[im 12/16]
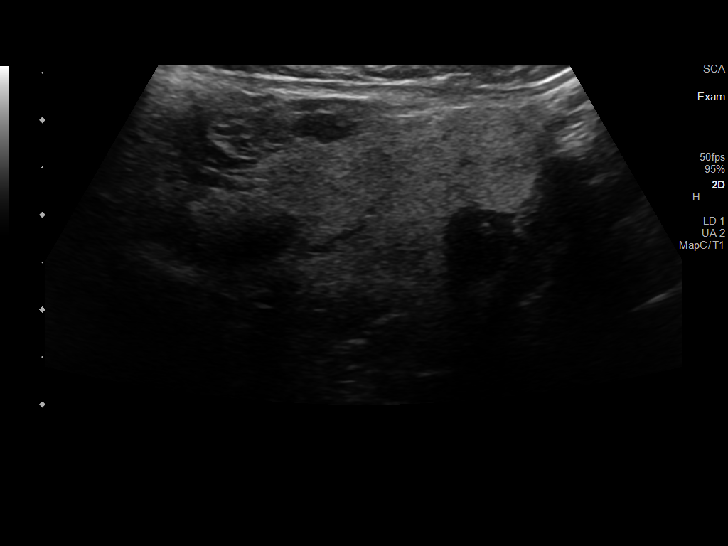
[im 13/16]
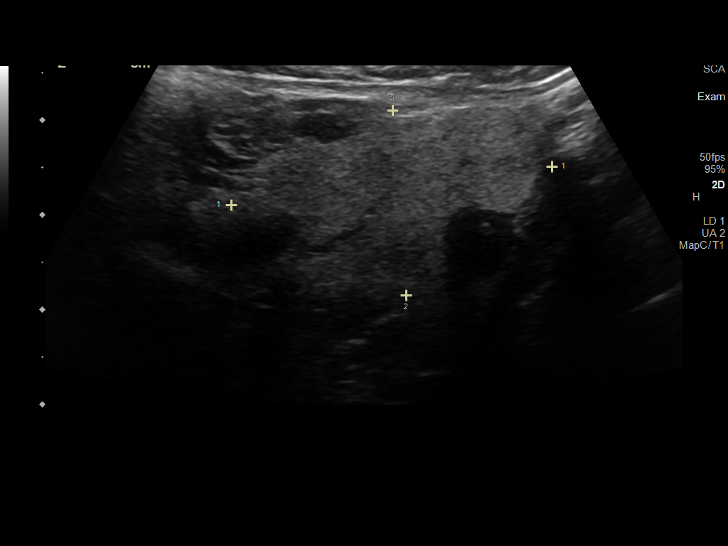
[im 15/16]
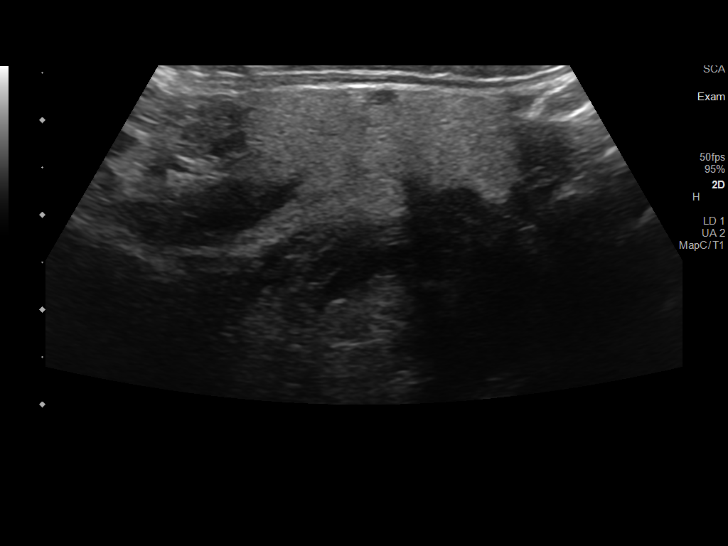
[im 16/16]
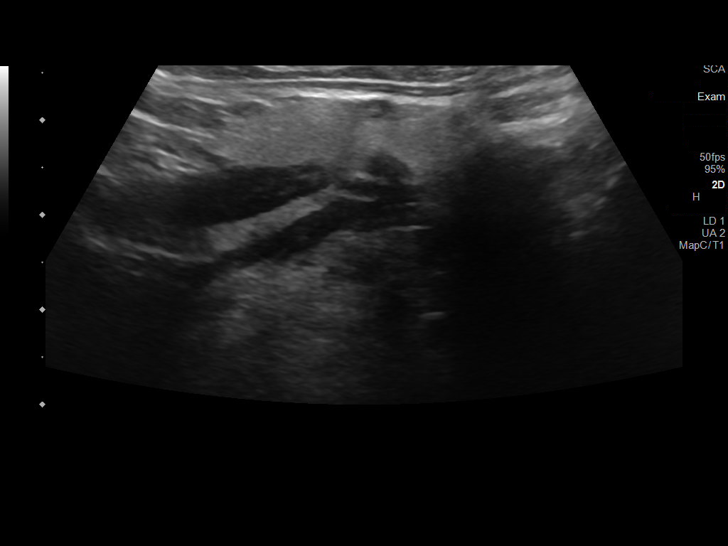

[13 of 16 positions shown; findings below may reference images not displayed]

FINDINGS: Targeted ultrasound in the submandibular region of concern. No
pathologically enlarged lymph nodes are demonstrated on the provided
images. The bilateral submandibular glands appear comparable in
size. The right submandibular gland measures 3.0 X 1.3 x 3.1 cm. The
left submandibular gland measures 3.7 x 1.8 x 2.0 cm. The
submandibular glands demonstrate no evidence of mass or definite
evidence of inflammation.
IMPRESSION: Targeted ultrasound of the submandibular region of concern.

No pathologically enlarged lymph nodes are demonstrated on the
provided images.

The submandibular glands are comparable in size and demonstrate no
evidence of mass or definite evidence of inflammation.

Consider contrast-enhanced neck CT for further evaluation.

## 2019-08-01 MED ORDER — CEFTRIAXONE SODIUM 1 G IJ SOLR
1.0000 g | Freq: Once | INTRAMUSCULAR | Status: AC
  Administered 2019-08-01: 1 g via INTRAMUSCULAR

## 2019-08-01 NOTE — Progress Notes (Signed)
Subjective:    Patient ID: Jeffery Boyd, male    DOB: Mar 25, 1986, 34 y.o.   MRN: [id number]  HPI  Pt in for in office follow up.  Pt states he is still having rt side submandibular area pain with some pain on tt side tenderness as well.  He is on 5th day of azithromycin He states still feel swollen and tender. He feels fatigued.  He has now gone thru course of amoxicillin and azithromycin.    Below is last hpi.  History of Present Illness:  [facility] and had rapid covid test January 27 and send out test came back on 29 th . Both came back negative.  Pt was given amoxicillin 500 mg bid x 10 days. Last dose this morning.  Pt states he had submandibular node tenderness on both sides. Rt side is worse but some on both sides. He also reported some some pnd.  Before st began he had some chills.   Last Wednesday on Feb 3rd had strep test and send out strep came back.   Pt states submandibilar nodes are still mild tender. No white dc reported on back of his throat.    Review of Systems  Constitutional: Positive for fatigue. Negative for chills and fever.  HENT: Positive for sore throat. Negative for congestion.   Respiratory: Negative for cough, chest tightness, shortness of breath and wheezing.   Cardiovascular: Negative for chest pain and palpitations.  Gastrointestinal: Negative for abdominal pain.  Musculoskeletal: Negative for back pain and myalgias.  Neurological: Negative for dizziness, syncope, weakness and light-headedness.  Hematological: Positive for adenopathy.  Psychiatric/Behavioral: Negative for behavioral problems.    Past Medical History:  Diagnosis Date  . Hyperlipidemia 05/26/2018  . Ventricular premature depolarization 05/26/2018   On Atenolol     Social History   Socioeconomic History  . Marital status: Married    Spouse name: Not on file  . Number of children: Not on file  . Years of education: Not on file  .  Highest education level: Not on file  Occupational History  . Not on file  Tobacco Use  . Smoking status: Never Smoker  . Smokeless tobacco: Never Used  Substance and Sexual Activity  . Alcohol use: Yes    Comment: occ  . Drug use: Not Currently  . Sexual activity: Not on file  Other Topics Concern  . Not on file  Social History Narrative  . Not on file   Social Determinants of Health   Financial Resource Strain:   . Difficulty of Paying Living Expenses: Not on file  Food Insecurity:   . Worried About Programme researcher, broadcasting/film/video in the Last Year: Not on file  . Ran Out of Food in the Last Year: Not on file  Transportation Needs:   . Lack of Transportation (Medical): Not on file  . Lack of Transportation (Non-Medical): Not on file  Physical Activity:   . Days of Exercise per Week: Not on file  . Minutes of Exercise per Session: Not on file  Stress:   . Feeling of Stress : Not on file  Social Connections:   . Frequency of Communication with Friends and Family: Not on file  . Frequency of Social Gatherings with Friends and Family: Not on file  . Attends Religious Services: Not on file  . Active Member of Clubs or Organizations: Not on file  . Attends Banker Meetings: Not on file  .  Marital Status: Not on file  Intimate Partner Violence:   . Fear of Current or Ex-Partner: Not on file  . Emotionally Abused: Not on file  . Physically Abused: Not on file  . Sexually Abused: Not on file    Past Surgical History:  Procedure Laterality Date  . WISDOM TOOTH EXTRACTION      Family History  Problem Relation Age of Onset  . Hypertension Father   . Hyperlipidemia Father   . Heart attack Paternal Uncle 71  . Asthma Mother     No Known Allergies  Current Outpatient Medications on File Prior to Visit  Medication Sig Dispense Refill  . atenolol (TENORMIN) 25 MG tablet Take 1 tab every other day x 1 month then twice a week x 1 month then daily as needed 90 tablet 3  .  azithromycin (ZITHROMAX) 250 MG tablet Take 2 tablets by mouth on day 1, followed by 1 tablet by mouth daily for 4 days. 6 tablet 0   No current facility-administered medications on file prior to visit.    BP 107/60 (BP Location: Right Arm, Cuff Size: Normal)   Pulse (!) 56   Temp 97.9 F (36.6 C) (Temporal)   Resp 12   Ht 5' 6.5" (1.689 m)   Wt 169 lb (76.7 kg)   SpO2 100%   BMI 26.87 kg/m       Objective:   Physical Exam  General  Mental Status - Alert. General Appearance - Well groomed. Not in acute distress.  Skin Rashes- No Rashes.  HEENT Head- Normal. Ear Auditory Canal - Left- Normal. Right - Normal.Tympanic Membrane- Left- Normal. Right- Normal. Eye Sclera/Conjunctiva- Left- Normal. Right- Normal. Nose & Sinuses Nasal Mucosa- Left-  Not Boggy and Congested. Right- not  Boggy and  Congested.Bilateral no maxillary and no frontal sinus pressure. Mouth & Throat Lips: Upper Lip- Normal: no dryness, cracking, pallor, cyanosis, or vesicular eruption. Lower Lip-Normal: no dryness, cracking, pallor, cyanosis or vesicular eruption. Buccal Mucosa- Bilateral- No Aphthous ulcers. Oropharynx- No Discharge or Erythema. Tonsils: Characteristics- Bilateral- No Erythema or Congestion. Size/Enlargement- Bilateral- No enlargement. Discharge- bilateral-None.     Chest and Lung Exam Auscultation: Breath Sounds:-Clear even and unlabored.  Cardiovascular Auscultation:Rythm- Regular, rate and rhythm. Murmurs & Other Heart Sounds:Ausculatation of the heart reveal- No Murmurs.  Lymphatic Head & Neck General Head & Neck Lymphatics: Bilateral: rt submandiblar node faint mild tender and maybe enlarge. Left side faint tender but not enlarged.       Assessment & Plan:  For persisting st and lymph node enlargement will give rocephin 1 gram  Finish zpack and already was on amoxicillin.   Will get epstein barr mono test today, cbc and ultrasound of neck/lymph node area.  If  studies negative then need to move up appointment with dentist as you mentioned some mild mid mandible pain on palpation. This could cause some of your symptoms if odontogenic infection. But that would have responded to former antibiotics  Follow up 7 days or as needed.  30 minutes total spent today.   Mackie Pai, PA-C

## 2019-08-01 NOTE — Patient Instructions (Addendum)
For persisting st and lymph node enlargement will give rocephin 1 gram  Finish zpack and already was on amoxicillin.   Will get epstein barr mono test today, cbc and ultrasound of neck/lymph node area.  If studies negative then need to move up appointment with dentist as you mentioned some mild mid mandible pain on palpation. This could cause some of your symptoms if odontogenic infection. But that would have responded to former antibiotics  Follow up 7 days or as needed.

## 2019-08-02 LAB — EPSTEIN-BARR VIRUS VCA ANTIBODY PANEL
EBV NA IgG: 185 U/mL — ABNORMAL HIGH
EBV VCA IgG: >750 U/mL — ABNORMAL HIGH
EBV VCA IgM: <36 U/mL

## 2019-08-03 ENCOUNTER — Encounter: Payer: Self-pay | Admitting: Medical

## 2019-08-24 ENCOUNTER — Encounter: Payer: Self-pay | Admitting: Medical

## 2019-11-14 DIAGNOSIS — H5711 Ocular pain, right eye: Secondary | ICD-10-CM | POA: Diagnosis not present

## 2019-11-14 DIAGNOSIS — S0500XA Injury of conjunctiva and corneal abrasion without foreign body, unspecified eye, initial encounter: Secondary | ICD-10-CM | POA: Diagnosis not present

## 2019-11-15 DIAGNOSIS — H2000 Unspecified acute and subacute iridocyclitis: Secondary | ICD-10-CM | POA: Diagnosis not present

## 2019-12-08 ENCOUNTER — Other Ambulatory Visit: Payer: Self-pay

## 2019-12-08 ENCOUNTER — Other Ambulatory Visit: Payer: Self-pay | Admitting: Family Medicine

## 2019-12-08 ENCOUNTER — Other Ambulatory Visit (INDEPENDENT_AMBULATORY_CARE_PROVIDER_SITE_OTHER): Payer: BC Managed Care – PPO

## 2019-12-08 DIAGNOSIS — E7849 Other hyperlipidemia: Secondary | ICD-10-CM

## 2019-12-08 LAB — LIPID PANEL
Cholesterol: 237 mg/dL — ABNORMAL HIGH (ref 0–200)
HDL: 45 mg/dL (ref >39.00)
LDL Cholesterol: 170 mg/dL — ABNORMAL HIGH (ref 0–99)
NonHDL: 192.34
Total CHOL/HDL Ratio: 5
Triglycerides: 112 mg/dL (ref 0.0–149.0)
VLDL: 22.4 mg/dL (ref 0.0–40.0)

## 2019-12-26 ENCOUNTER — Other Ambulatory Visit: Payer: Self-pay | Admitting: Family Medicine

## 2019-12-26 DIAGNOSIS — E7849 Other hyperlipidemia: Secondary | ICD-10-CM

## 2019-12-26 MED ORDER — ROSUVASTATIN CALCIUM 20 MG PO TABS: 20.0000 mg | ORAL_TABLET | Freq: Every day | ORAL | 3 refills | Status: DC

## 2020-01-03 ENCOUNTER — Other Ambulatory Visit: Payer: Self-pay | Admitting: Family Medicine

## 2020-01-03 DIAGNOSIS — E785 Hyperlipidemia, unspecified: Secondary | ICD-10-CM

## 2020-01-03 MED ORDER — ATORVASTATIN CALCIUM 40 MG PO TABS: 40.0000 mg | ORAL_TABLET | Freq: Every day | ORAL | 3 refills | Status: DC

## 2020-01-10 ENCOUNTER — Other Ambulatory Visit: Payer: Self-pay

## 2020-01-10 ENCOUNTER — Ambulatory Visit (INDEPENDENT_AMBULATORY_CARE_PROVIDER_SITE_OTHER)
Admission: RE | Admit: 2020-01-10 | Discharge: 2020-01-10 | Disposition: A | Discharge status: Home / Self Care | Payer: Self-pay | Source: Ambulatory Visit | Attending: Cardiology | Admitting: Cardiology

## 2020-01-10 DIAGNOSIS — E785 Hyperlipidemia, unspecified: Secondary | ICD-10-CM

## 2020-01-10 IMAGING — CT CT CARDIAC CORONARY ARTERY CALCIUM SCORE
3 series · 14 of 20 positions shown, 15 images · non-contrast
Comparison: None.
COMPARISON: None.

Addendum:
EXAM:
OVER-READ INTERPRETATION  CT CHEST

The following report is an over-read performed by radiologist Dr.
JUMPER [REDACTED] on [DATE]. This
over-read does not include interpretation of cardiac or coronary
anatomy or pathology. The coronary calcium score interpretation by
the cardiologist is attached.
CLINICAL DATA: Risk stratification
Coronary Calcium Score
TECHNIQUE: The patient was scanned on a Siemens Force scanner. Axial
non-contrast 3 mm slices were carried out through the heart. The
data set was analyzed on a dedicated work station and scored using
the Agatson method.

[Series 2: casc 3.0 bv41 2 bestdiast 67 % · axial · 0.35mm/px · z∈[-228,-150]mm · 4 of 44 slices shown, 5 images]
[im 9/44  vessel]
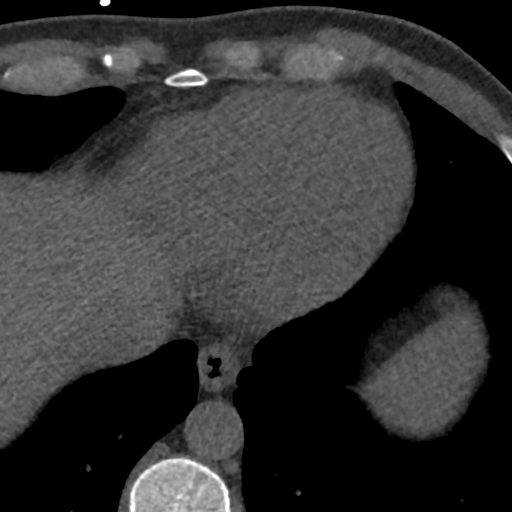
[im 9/44  lung]
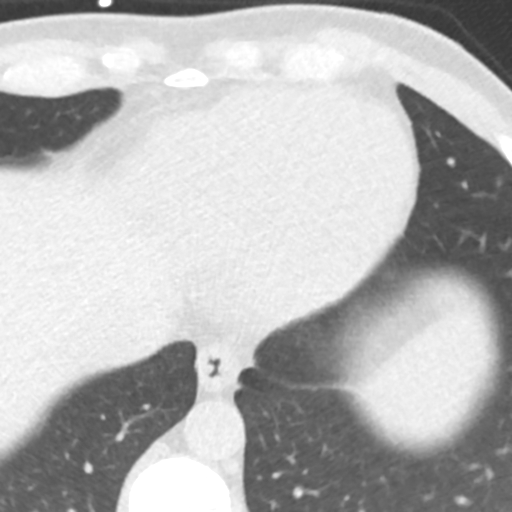
[im 18/44  vessel]
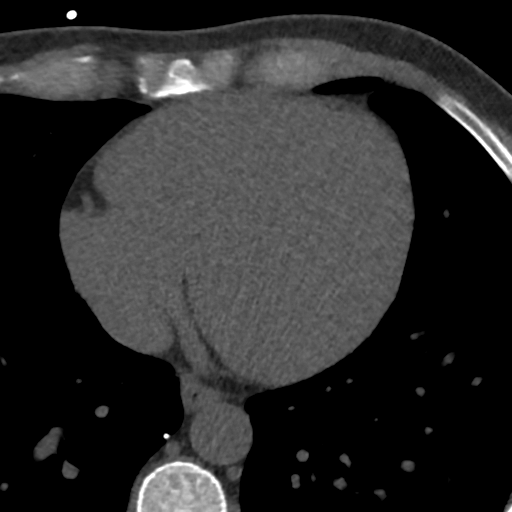
[im 26/44  vessel]
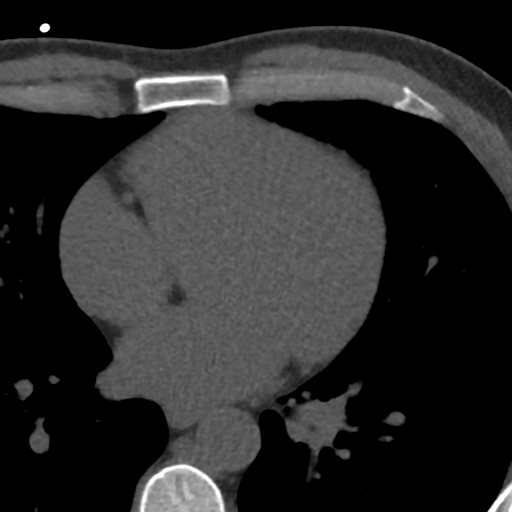
[im 35/44  vessel]
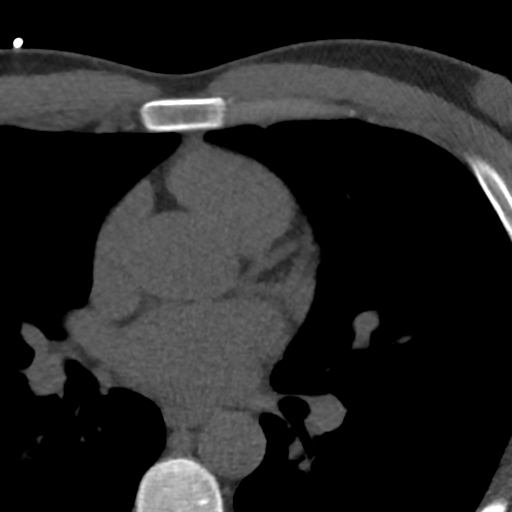

[Series 3: lung 69 % · axial · 0.68mm/px · z∈[-230,-146]mm · 5 of 44 slices shown]
[im 8/44  lung]
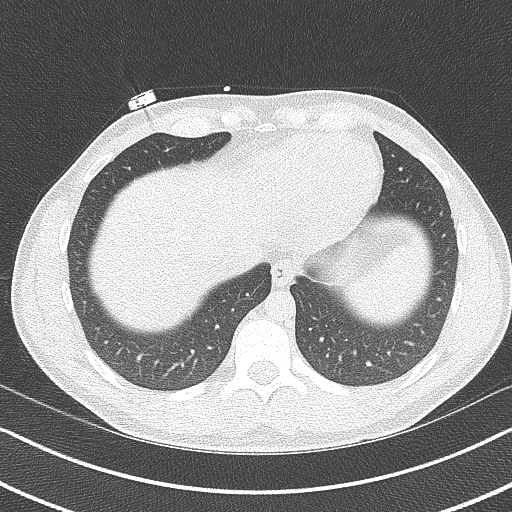
[im 15/44  lung]
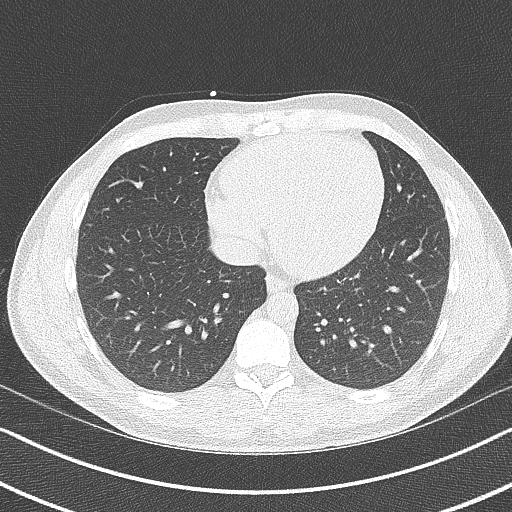
[im 22/44  lung]
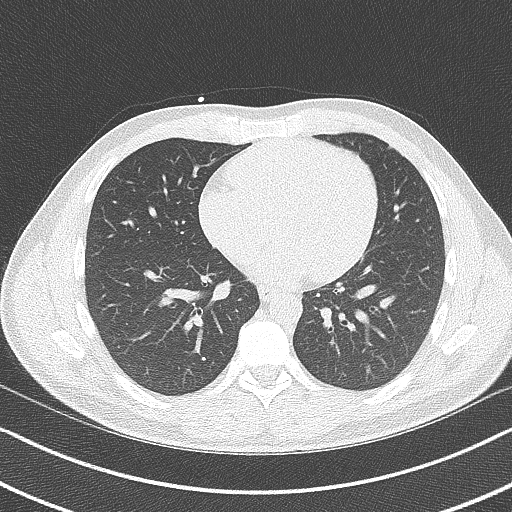
[im 29/44  lung]
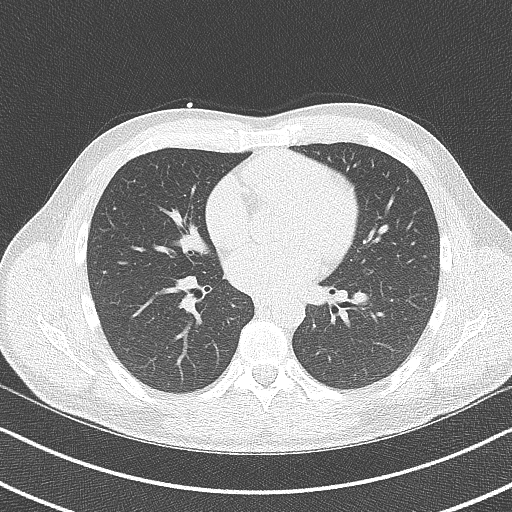
[im 36/44  lung]
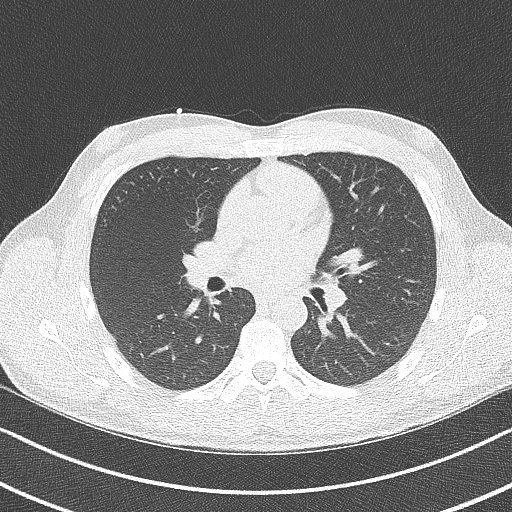

[Series 4: lung st 69 % · axial · 0.68mm/px · z∈[-230,-146]mm · 5 of 44 slices shown]
[im 8/44  lung]
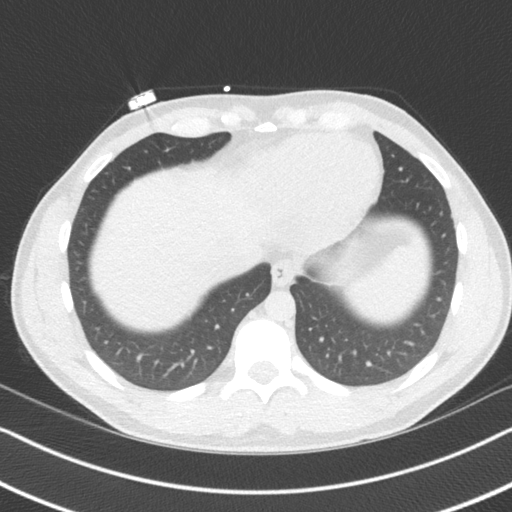
[im 15/44  lung]
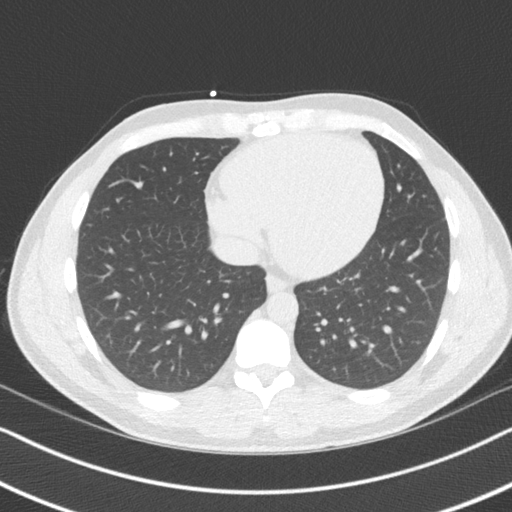
[im 22/44  lung]
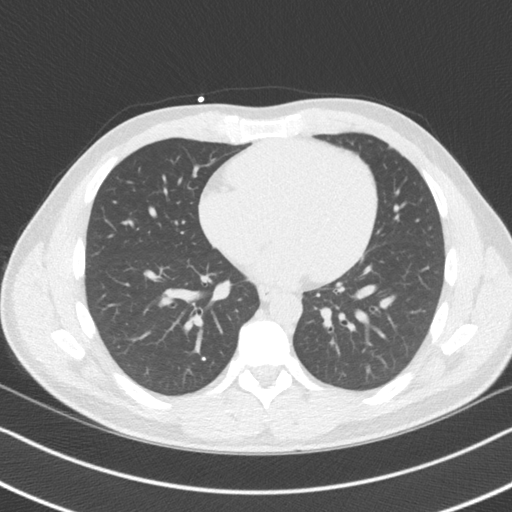
[im 29/44  lung]
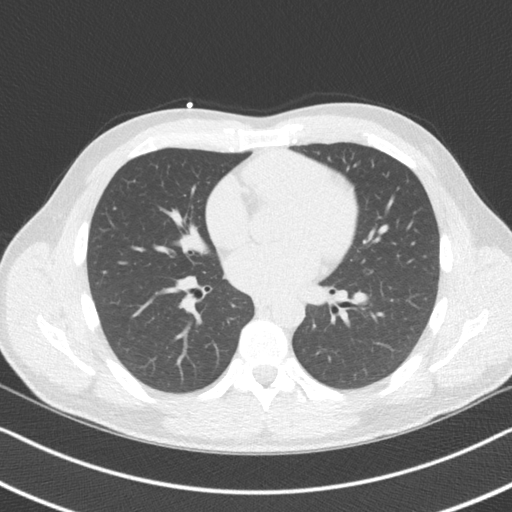
[im 36/44  lung]
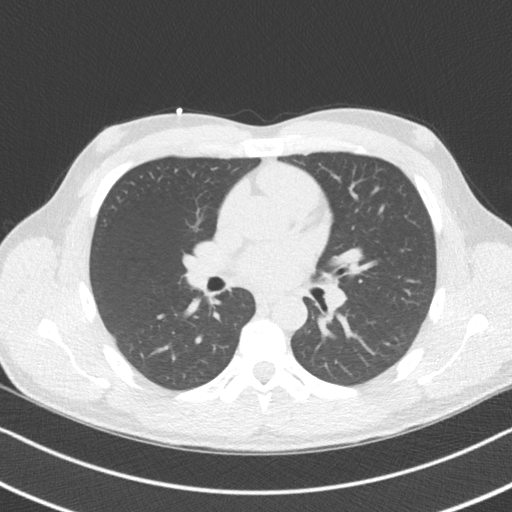

[14 of 20 positions shown; findings below may reference images not displayed]

FINDINGS: Within the visualized portions of the thorax there are no suspicious
appearing pulmonary nodules or masses, there is no acute
consolidative airspace disease, no pleural effusions, no
pneumothorax and no lymphadenopathy. Visualized portions of the
upper abdomen are unremarkable. There are no aggressive appearing
lytic or blastic lesions noted in the visualized portions of the
skeleton.
IMPRESSION: No significant incidental noncardiac findings are noted.
FINDINGS: Non-cardiac: See separate report from [REDACTED].

Ascending Aorta: Normal size

Pericardium: Normal thickness

Coronary arteries: calcium score 0
IMPRESSION: Coronary calcium score of 0.

*** End of Addendum ***
EXAM:
OVER-READ INTERPRETATION  CT CHEST

The following report is an over-read performed by radiologist Dr.
JUMPER [REDACTED] on [DATE]. This
over-read does not include interpretation of cardiac or coronary
anatomy or pathology. The coronary calcium score interpretation by
the cardiologist is attached.
FINDINGS: Within the visualized portions of the thorax there are no suspicious
appearing pulmonary nodules or masses, there is no acute
consolidative airspace disease, no pleural effusions, no
pneumothorax and no lymphadenopathy. Visualized portions of the
upper abdomen are unremarkable. There are no aggressive appearing
lytic or blastic lesions noted in the visualized portions of the
skeleton.
IMPRESSION: No significant incidental noncardiac findings are noted.

## 2020-02-07 MED ORDER — VALACYCLOVIR HCL 1 G PO TABS: ORAL_TABLET | ORAL | 1 refills | Status: DC

## 2020-03-08 ENCOUNTER — Telehealth (INDEPENDENT_AMBULATORY_CARE_PROVIDER_SITE_OTHER): Payer: BC Managed Care – PPO | Admitting: Family Medicine

## 2020-03-08 ENCOUNTER — Encounter: Payer: Self-pay | Admitting: Family Medicine

## 2020-03-08 DIAGNOSIS — M25562 Pain in left knee: Secondary | ICD-10-CM | POA: Diagnosis not present
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
## 2020-03-19 ENCOUNTER — Ambulatory Visit: Payer: Self-pay

## 2020-03-19 ENCOUNTER — Encounter: Payer: Self-pay | Admitting: Family Medicine

## 2020-03-19 ENCOUNTER — Other Ambulatory Visit: Payer: Self-pay

## 2020-03-19 ENCOUNTER — Ambulatory Visit (INDEPENDENT_AMBULATORY_CARE_PROVIDER_SITE_OTHER): Payer: BC Managed Care – PPO | Admitting: Family Medicine

## 2020-03-19 VITALS — BP 121/78 | HR 61 | Ht 66.0 in | Wt 165.0 lb

## 2020-03-19 DIAGNOSIS — T148XXA Other injury of unspecified body region, initial encounter: Secondary | ICD-10-CM | POA: Insufficient documentation

## 2020-03-19 DIAGNOSIS — M25562 Pain in left knee: Secondary | ICD-10-CM

## 2020-03-19 DIAGNOSIS — S8992XA Unspecified injury of left lower leg, initial encounter: Secondary | ICD-10-CM

## 2020-03-19 HISTORY — DX: Other injury of unspecified body region, initial encounter: T14.8XXA

## 2020-03-19 NOTE — Patient Instructions (Signed)
Nice to meet you Please try the exercises  Please continue the brace Please use ice as needed   Please send me a message in MyChart with any questions or updates.  Please see me back in 4 weeks or call and do a virtual visit. As needed if better.   --Dr. Jordan Likes

## 2020-03-19 NOTE — Progress Notes (Signed)
Jeffery Boyd - 34 y.o. male MRN [id number]  Date of birth: 1986-01-19  SUBJECTIVE:  Including CC & ROS.  Chief Complaint  Patient presents with  . Knee Pain    left x 9 weeks    Jeffery Boyd is a 34 y.o. male that is presenting with left knee pain.  The pain has been ongoing for about 8 weeks.  He had an initial hyperextension injury while playing soccer.  Since that time he feels pain with going up and down stairs or while making twisting maneuvers.  He has little to no pain with straight line running along with the brace on.  No history of surgery.   Review of Systems See HPI   HISTORY: Past Medical, Surgical, Social, and Family History Reviewed & Updated per EMR.   Pertinent Historical Findings include:  Past Medical History:  Diagnosis Date  . Hyperlipidemia 05/26/2018  . 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 WISDOM TOOTH EXTRACTION      Family History  Problem Relation Age of Onset  . Hypertension Father   . Hyperlipidemia Father   . Heart attack Paternal Uncle 40  . Asthma Mother     Social History   Socioeconomic History  . Marital status: Married    Spouse name: Not on file  . Number of children: Not on file  . Years of education: Not on file  . Highest education level: Not on file  Occupational History  . Not on file  Tobacco Use  . Smoking status: Never Smoker  . Smokeless tobacco: Never Used  Vaping Use  . Vaping Use: Never used  Substance and Sexual Activity  . Alcohol use: Yes    Comment: occ  . Drug use: Not Currently  . Sexual activity: Not on file  Other Topics Concern  . Not on file  Social History Narrative  . Not on file   Social Determinants of Health   Financial Resource Strain:   . Difficulty of Paying Living Expenses: Not on file  Food Insecurity:   . Worried About Programme researcher, broadcasting/film/video in the Last Year: Not on file  . Ran Out of Food in the Last  Year: Not on file  Transportation Needs:   . Lack of Transportation (Medical): Not on file  . Lack of Transportation (Non-Medical): Not on file  Physical Activity:   . Days of Exercise per Week: Not on file  . Minutes of Exercise per Session: Not on file  Stress:   . Feeling of Stress : Not on file  Social Connections:   . Frequency of Communication with Friends and Family: Not on file  . Frequency of Social Gatherings with Friends and Family: Not on file  . Attends Religious Services: Not on file  . Active Member of Clubs or Organizations: Not on file  . Attends Banker Meetings: Not on file  . Marital Status: Not on file  Intimate Partner Violence:   . Fear of Current or Ex-Partner: Not on file  . Emotionally Abused: Not on file  . Physically Abused: Not on file  . Sexually Abused: Not on file     PHYSICAL EXAM:  VS: BP 121/78   Pulse 61   Ht 5\' 6"  (1.676 m)   Wt 165 lb (74.8 kg)   BMI 26.63 kg/m  Physical Exam Gen: NAD, alert, cooperative with exam, well-appearing MSK:  Left knee: No effusion.  No tenderness to palpation over the medial lateral joint space. Normal strength to resistance. Normal range of motion. Negative Thessaly test. No instability with valgus or varus stress testing. Neurovascular intact  Limited ultrasound: Left knee:  No effusion in the suprapatellar pouch. Normal-appearing quadricep patellar tendon. Normal-appearing medial and lateral joint space. Normal-appearing MCL and LCL. No changes of the medial or lateral retinaculum.  Summary: No structural changes appreciated.  Ultrasound and interpretation by [name], MD    ASSESSMENT & PLAN:   Injury of left knee Injury occurred about 7 weeks ago.  Had hyperextension injury.  No effusion or instability on exam.  Likely a bony contusion from ongoing pain. -Counseled on home exercise therapy and supportive care. -Counseled on wearing the brace. -Could consider further  imaging or physical therapy if ongoing.

## 2020-03-19 NOTE — Assessment & Plan Note (Signed)
Injury occurred about 7 weeks ago.  Had hyperextension injury.  No effusion or instability on exam.  Likely a bony contusion from ongoing pain. -Counseled on home exercise therapy and supportive care. -Counseled on wearing the brace. -Could consider further imaging or physical therapy if ongoing.

## 2020-06-12 ENCOUNTER — Ambulatory Visit (INDEPENDENT_AMBULATORY_CARE_PROVIDER_SITE_OTHER): Payer: BC Managed Care – PPO | Admitting: Family Medicine

## 2020-06-12 ENCOUNTER — Encounter: Payer: Self-pay | Admitting: Family Medicine

## 2020-06-12 ENCOUNTER — Other Ambulatory Visit: Payer: Self-pay

## 2020-06-12 VITALS — BP 108/68 | HR 67 | Temp 98.0°F | Ht 66.0 in | Wt 172.0 lb

## 2020-06-12 DIAGNOSIS — Z0000 Encounter for general adult medical examination without abnormal findings: Secondary | ICD-10-CM

## 2020-06-12 DIAGNOSIS — Z1159 Encounter for screening for other viral diseases: Secondary | ICD-10-CM

## 2020-06-12 LAB — COMPREHENSIVE METABOLIC PANEL
ALT: 13 U/L (ref 0–53)
AST: 17 U/L (ref 0–37)
Albumin: 4.5 g/dL (ref 3.5–5.2)
Alkaline Phosphatase: 48 U/L (ref 39–117)
BUN: 16 mg/dL (ref 6–23)
CO2: 31 mEq/L (ref 19–32)
Calcium: 9.6 mg/dL (ref 8.4–10.5)
Chloride: 103 mEq/L (ref 96–112)
Creatinine, Ser: 1.22 mg/dL (ref 0.40–1.50)
GFR: 77.38 mL/min (ref >60.00)
Glucose, Bld: 83 mg/dL (ref 70–99)
Potassium: 4.1 mEq/L (ref 3.5–5.1)
Sodium: 139 mEq/L (ref 135–145)
Total Bilirubin: 0.6 mg/dL (ref 0.2–1.2)
Total Protein: 6.5 g/dL (ref 6.0–8.3)

## 2020-06-12 LAB — LIPID PANEL
Cholesterol: 229 mg/dL — ABNORMAL HIGH (ref 0–200)
HDL: 54.4 mg/dL (ref >39.00)
LDL Cholesterol: 156 mg/dL — ABNORMAL HIGH (ref 0–99)
NonHDL: 174.72
Total CHOL/HDL Ratio: 4
Triglycerides: 95 mg/dL (ref 0.0–149.0)
VLDL: 19 mg/dL (ref 0.0–40.0)

## 2020-06-12 NOTE — Patient Instructions (Addendum)
Give [place] 2-3 business days to get the results of your labs back.   Keep the diet clean and stay active.  Please follow up with Dr. Jordan Likes.   Do monthly self testicular checks in the shower. You are feeling for lumps/bumps that don't belong. If you feel anything like this, let me know!  Let us know if you need anything.

## 2020-06-12 NOTE — Progress Notes (Signed)
Chief Complaint  Patient presents with  . Annual Exam    [name] is here for a complete physical.   His last physical was >1 year ago.  Current diet: in general, a "healthy" diet.   Current exercise: running, soccer Weight trend: increased a little Fatigue out of ordinary? No.  Seat belt? Yes.    Health maintenance Tetanus- Yes HIV- Yes Hep C- No  Past Medical History:  Diagnosis Date  . Hyperlipidemia 05/26/2018  . Ventricular premature depolarization 05/26/2018   On Atenolol     Past Surgical History:  Procedure Laterality Date  . WISDOM TOOTH EXTRACTION      Medications  Current Outpatient Medications on File Prior to Visit  Medication Sig Dispense Refill  . atenolol (TENORMIN) 25 MG tablet Take 1 tab every other day x 1 month then twice a week x 1 month then daily as needed 90 tablet 3  . atorvastatin (LIPITOR) 40 MG tablet Take 1 tablet (40 mg total) by mouth daily. 90 tablet 3  . valACYclovir (VALTREX) 1000 MG tablet Take 2 tabs and repeat in 12 hrs for cold sores. 30 tablet 1   Allergies No Known Allergies  Family History Family History  Problem Relation Age of Onset  . Hypertension Father   . Hyperlipidemia Father   . Heart attack Paternal Uncle 40  . Asthma Mother     Review of Systems: Constitutional: no fevers or chills Eye:  no recent significant change in vision Ear/Nose/Mouth/Throat:  Ears:  no hearing loss Nose/Mouth/Throat:  no complaints of nasal congestion, no sore throat Cardiovascular:  no chest pain Respiratory:  no shortness of breath Gastrointestinal:  no abdominal pain, no change in bowel habits GU:  Male: negative for dysuria Musculoskeletal/Extremities: +L knee pain Integumentary (Skin/Breast):  no abnormal skin lesions reported Neurologic:  no headaches Endocrine: No unexpected weight changes Hematologic/Lymphatic:  no night sweats  Exam BP 108/68 (BP Location: Left Arm, Patient Position: Sitting, Cuff Size:  Normal)   Pulse 67   Temp 98 F (36.7 C) (Oral)   Ht 5\' 6"  (1.676 m)   Wt 172 lb (78 kg)   SpO2 99%   BMI 27.76 kg/m  General:  well developed, well nourished, in no apparent distress Skin:  no significant moles, warts, or growths Head:  no masses, lesions, or tenderness Eyes:  pupils equal and round, sclera anicteric without injection Ears:  canals without lesions, TMs shiny without retraction, no obvious effusion, no erythema Nose:  nares patent, septum midline, mucosa normal Throat/Pharynx:  lips and gingiva without lesion; tongue and uvula midline; non-inflamed pharynx; no exudates or postnasal drainage Neck: neck supple without adenopathy, thyromegaly, or masses Lungs:  clear to auscultation, breath sounds equal bilaterally, no respiratory distress Cardio:  regular rate and rhythm, no bruits, no LE edema Abdomen:  abdomen soft, nontender; bowel sounds normal; no masses or organomegaly Genital (male): Deferred Rectal: Deferred Musculoskeletal:  symmetrical muscle groups noted without atrophy or deformity Extremities:  no clubbing, cyanosis, or edema, no deformities, no skin discoloration Neuro:  gait normal; deep tendon reflexes normal and symmetric Psych: well oriented with normal range of affect and appropriate judgment/insight  Assessment and Plan  Well adult exam - Plan: Comprehensive metabolic panel, Lipid panel  Encounter for hepatitis C screening test for low risk patient - Plan: Hepatitis C antibody   Well 34 y.o. male. Counseled on diet and exercise. Self testicular exams recommended at least monthly.  Needs to f/u w sports med  regarding knee. I doubt injections/PT would be beneficial from my end at this point.  Other orders as above. Follow up in 6 mo pending the above workup. The patient voiced understanding and agreement to the plan.  Jilda Roche [place], DO 06/12/20 8:24 AM

## 2020-06-13 LAB — HEPATITIS C ANTIBODY
Hepatitis C Ab: NONREACTIVE
SIGNAL TO CUT-OFF: 0 (ref <1.00)

## 2020-06-18 ENCOUNTER — Other Ambulatory Visit: Payer: Self-pay | Admitting: Family Medicine

## 2020-06-18 DIAGNOSIS — S8992XA Unspecified injury of left lower leg, initial encounter: Secondary | ICD-10-CM

## 2020-06-18 NOTE — Progress Notes (Signed)
Initial injury was in September patient still having ongoing pain.  Concern for internal derangement.  Will check x-ray and order MRI.  Myra Rude, MD [facility] 06/18/2020, 9:54 AM

## 2020-07-15 ENCOUNTER — Other Ambulatory Visit: Payer: Self-pay

## 2020-07-15 ENCOUNTER — Ambulatory Visit
Admission: RE | Admit: 2020-07-15 | Discharge: 2020-07-15 | Disposition: A | Discharge status: Home / Self Care | Payer: BC Managed Care – PPO | Source: Ambulatory Visit | Attending: Family Medicine | Admitting: Family Medicine

## 2020-07-15 DIAGNOSIS — S8992XA Unspecified injury of left lower leg, initial encounter: Secondary | ICD-10-CM

## 2020-07-15 DIAGNOSIS — M25562 Pain in left knee: Secondary | ICD-10-CM | POA: Diagnosis not present

## 2020-07-15 IMAGING — MR MR KNEE*L* W/O CM
4 of 6 series · 23 of 40 positions shown · non-contrast
Comparison: None.

CLINICAL DATA: Persistent left knee pain after hyperextension
injury playing soccer 6 months ago. No prior surgery.

EXAM:
MRI OF THE LEFT KNEE WITHOUT CONTRAST
TECHNIQUE: Multiplanar, multisequence MR imaging of the knee was performed. No
intravenous contrast was administered.

[Series 4: T2 fat-sat · coronal · 4.0mm · 0.59mm/px · 6 of 24 slices shown (1 of 2)]
[im 1/24]
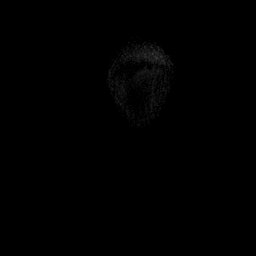
[im 5/24]
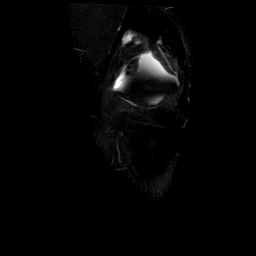
[im 10/24]
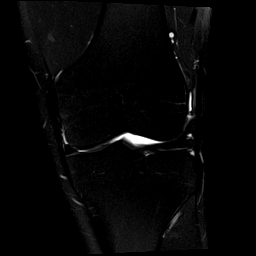
[im 14/24]
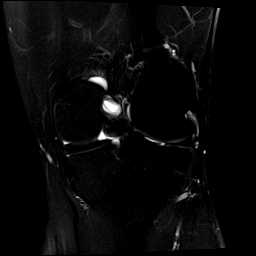
[im 19/24]
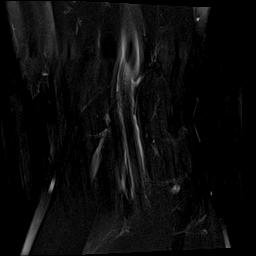
[im 24/24]
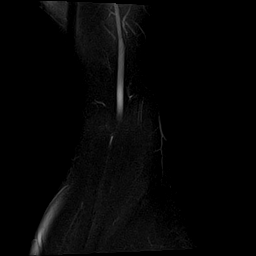

[Series 5: T1 · coronal · 4.0mm · 0.29mm/px · 3 of 25 slices shown]
[im 5/25]
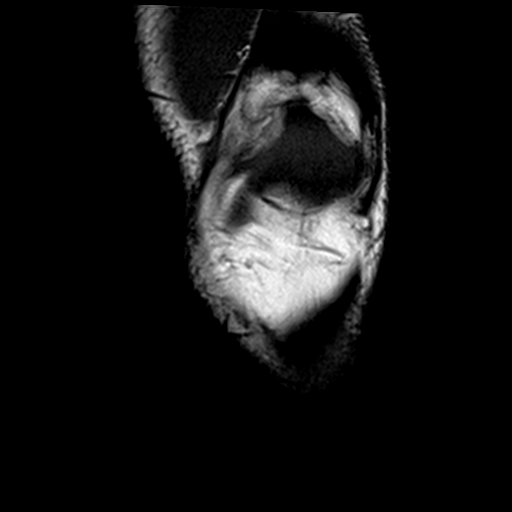
[im 15/25]
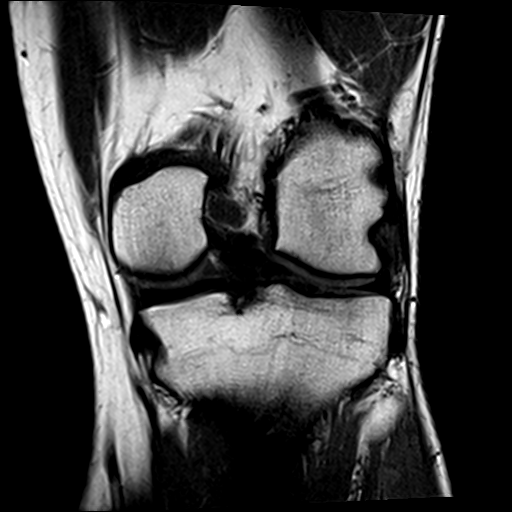
[im 25/25]
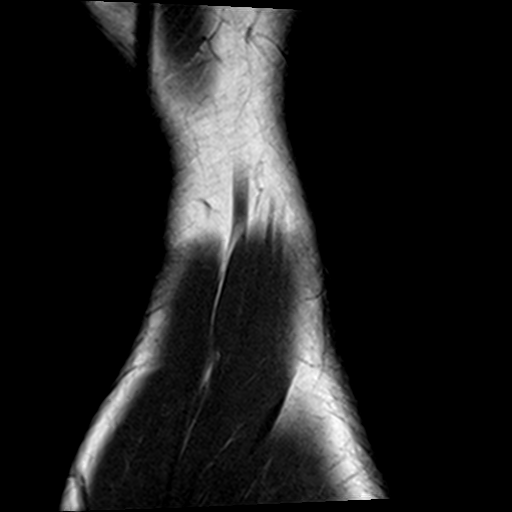

[Series 7: PD fat-sat · sagittal · 3.0mm · 0.29mm/px · 7 of 27 slices shown]
[im 1/27]
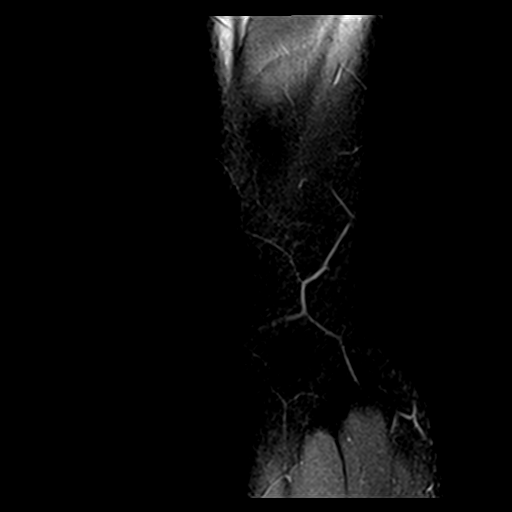
[im 5/27]
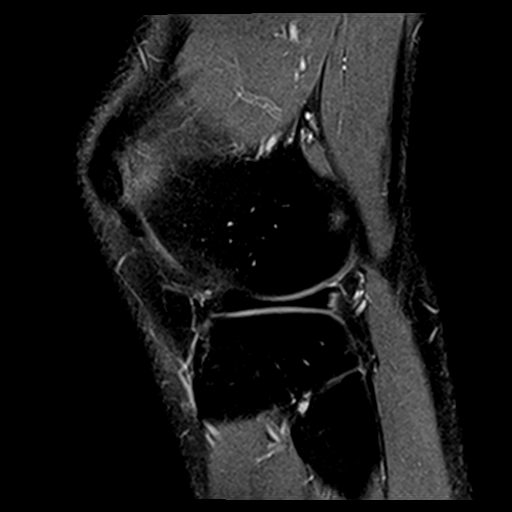
[im 9/27]
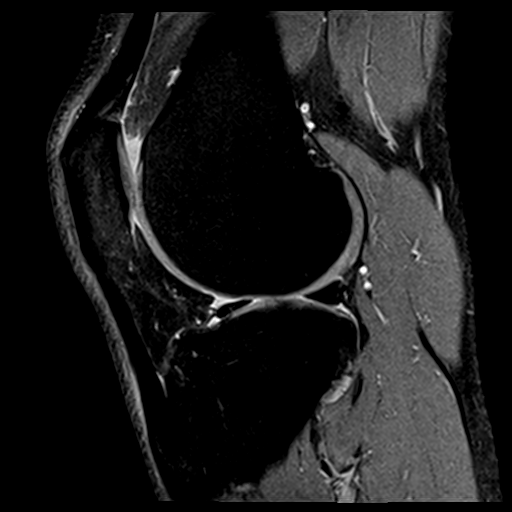
[im 14/27]
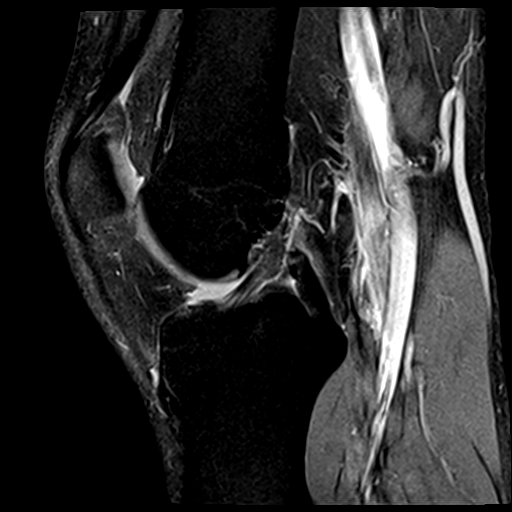
[im 18/27]
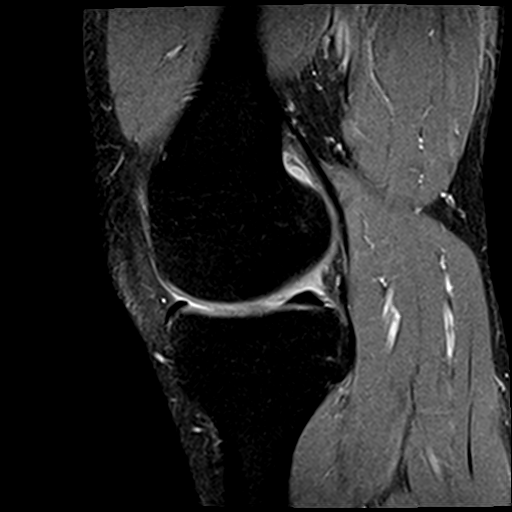
[im 22/27]
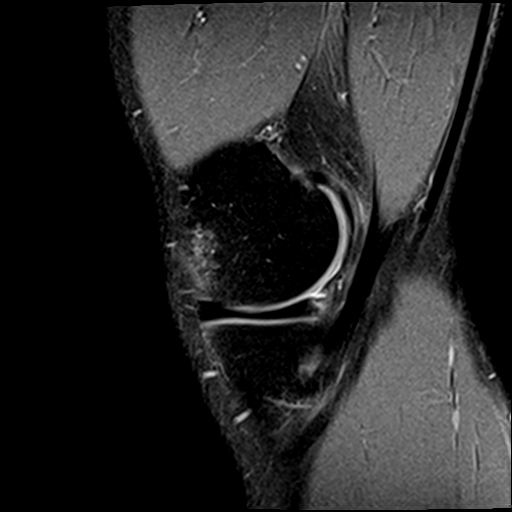
[im 27/27]
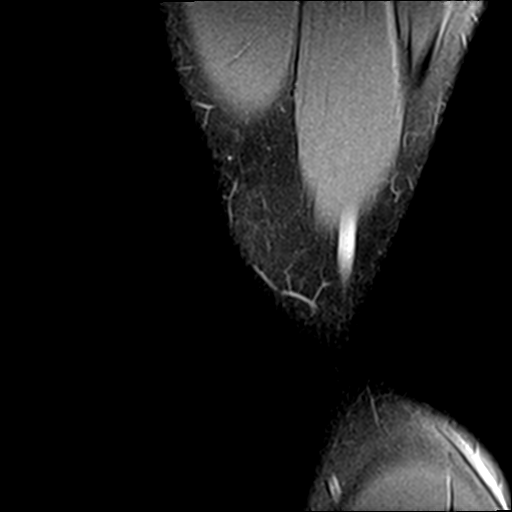

[Series 8: T2 fat-sat · sagittal · 3.0mm · 0.29mm/px · 7 of 27 slices shown (2 of 2)]
[im 1/27]
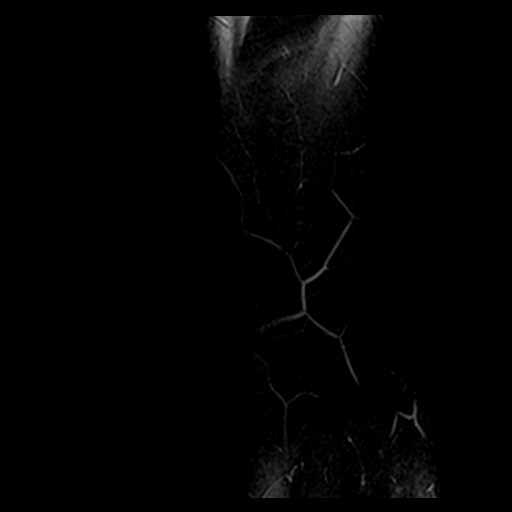
[im 5/27]
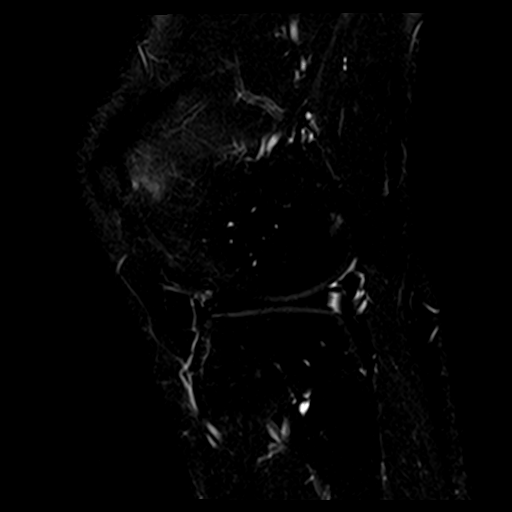
[im 9/27]
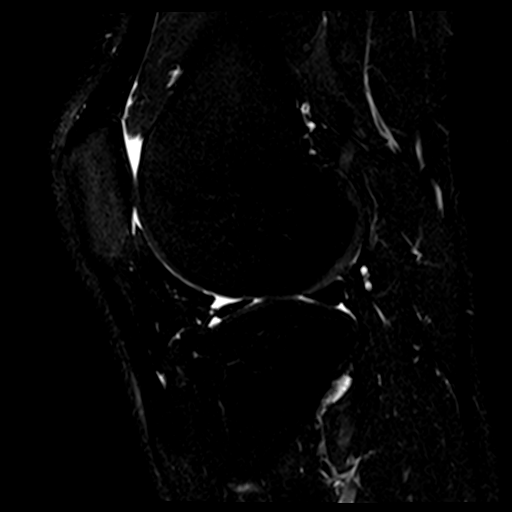
[im 14/27]
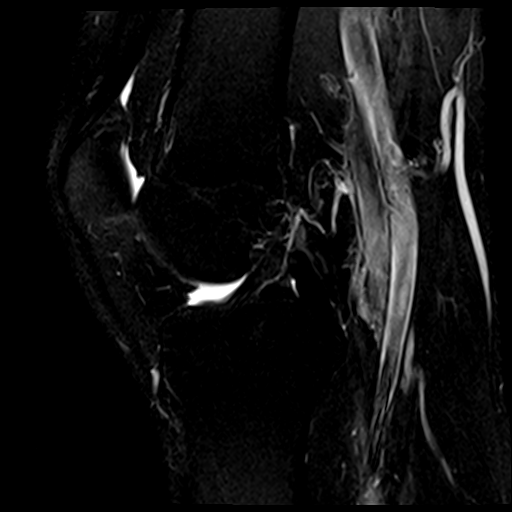
[im 18/27]
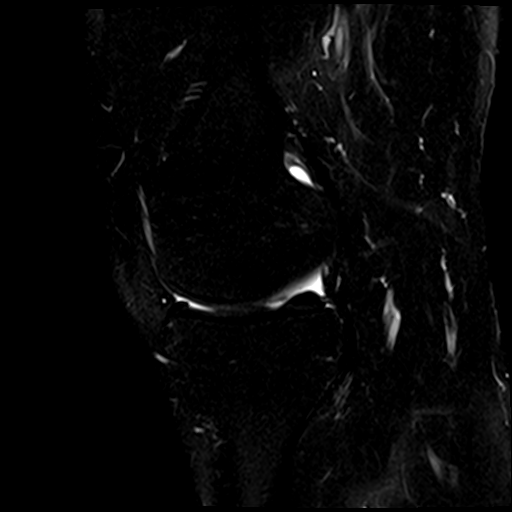
[im 22/27]
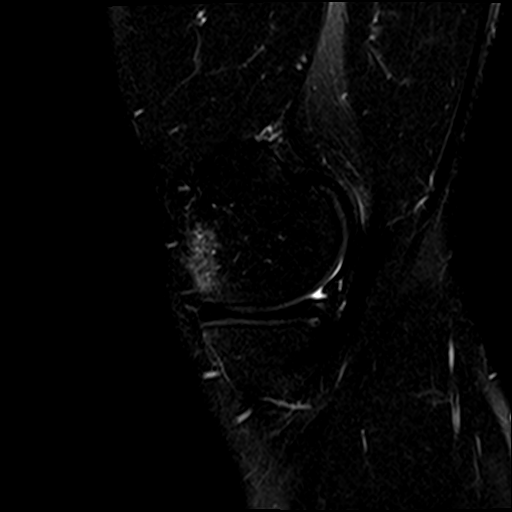
[im 27/27]
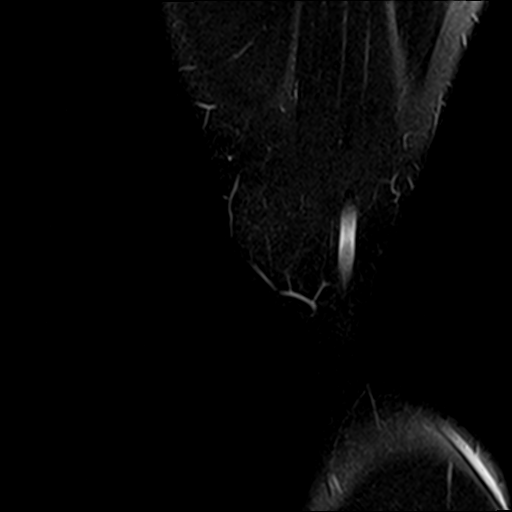

[23 of 40 positions shown; findings below may reference images not displayed]

FINDINGS: MENISCI

Medial meniscus:  Intact.

Lateral meniscus:  Intact.

LIGAMENTS

Cruciates:  Intact ACL and PCL.

Collaterals: Medial collateral ligament is intact. Lateral
collateral ligament complex is intact.

CARTILAGE

Patellofemoral:  Normal.

Medial: Focal fissuring and partial-thickness cartilage loss over
the mesial aspect of the medial femoral condyle.

Lateral: Superficial fissuring and irregularity over the central and
mesial lateral tibial plateau.

Joint:  No joint effusion. Normal Hoffa's fat. No plical thickening.

Popliteal Fossa:  No Baker cyst. Intact popliteus tendon.

Extensor Mechanism: Intact quadriceps tendon and patellar tendon.
Intact medial and lateral patellar retinaculum. Intact MPFL.

Bones: Mild marrow edema in the peripheral medial femoral condyle.
No acute fracture or dislocation. No suspicious bone lesion.

Other: None.
IMPRESSION: 1. No meniscal or ligamentous injury.
2. Mild contusion of the peripheral medial femoral condyle. No
fracture.
3. Early medial and lateral compartment degenerative cartilage
abnormalities as described above.

## 2020-07-17 ENCOUNTER — Telehealth: Payer: Self-pay | Admitting: Family Medicine

## 2020-07-17 NOTE — Telephone Encounter (Signed)
Called pt to schedule MRI result review--no answer msg left to call office.  --glh

## 2020-08-08 ENCOUNTER — Encounter: Payer: Self-pay | Admitting: Family Medicine

## 2020-08-08 ENCOUNTER — Telehealth (INDEPENDENT_AMBULATORY_CARE_PROVIDER_SITE_OTHER): Payer: BC Managed Care – PPO | Admitting: Family Medicine

## 2020-08-08 ENCOUNTER — Other Ambulatory Visit: Payer: Self-pay

## 2020-08-08 DIAGNOSIS — T148XXA Other injury of unspecified body region, initial encounter: Secondary | ICD-10-CM | POA: Diagnosis not present

## 2020-08-08 NOTE — Progress Notes (Signed)
Virtual Visit via Video Note  I connected with Jeffery Boyd on 08/08/20 at  8:00 AM EST by a video enabled telemedicine application and verified that I am speaking with the correct person using two identifiers.  Location: Patient: home Provider: office   I discussed the limitations of evaluation and management by telemedicine and the availability of in person appointments. The patient expressed understanding and agreed to proceed.  History of Present Illness:  Jeffery Boyd is a 35 year old male that is following up after the MRI of his left knee.  This was demonstrating a contusion of the peripheral for medial femoral condyle.   Observations/Objective:  Gen: NAD, alert, cooperative with exam, well-appearing  Assessment and Plan:  Contusion of left medial femoral condyle: Mri was showing contusion of the medial femoral condyle. -Counseled on home exercise therapy and supportive care. -Referral to physical therapy.  Follow Up Instructions:    I discussed the assessment and treatment plan with the patient. The patient was provided an opportunity to ask questions and all were answered. The patient agreed with the plan and demonstrated an understanding of the instructions.   The patient was advised to call back or seek an in-person evaluation if the symptoms worsen or if the condition fails to improve as anticipated.    [name], MD

## 2020-08-08 NOTE — Assessment & Plan Note (Signed)
Mri was showing contusion of the medial femoral condyle. -Counseled on home exercise therapy and supportive care. -Referral to physical therapy.

## 2020-09-29 DIAGNOSIS — Z20822 Contact with and (suspected) exposure to covid-19: Secondary | ICD-10-CM | POA: Diagnosis not present

## 2020-11-30 ENCOUNTER — Encounter (HOSPITAL_BASED_OUTPATIENT_CLINIC_OR_DEPARTMENT_OTHER): Payer: Self-pay

## 2020-12-14 ENCOUNTER — Other Ambulatory Visit: Payer: Self-pay

## 2020-12-14 ENCOUNTER — Ambulatory Visit: Payer: BC Managed Care – PPO | Admitting: Family Medicine

## 2020-12-14 ENCOUNTER — Encounter: Payer: Self-pay | Admitting: Family Medicine

## 2020-12-14 VITALS — BP 108/68 | HR 65 | Temp 98.2°F | Ht 66.0 in | Wt 173.0 lb

## 2020-12-14 DIAGNOSIS — E7800 Pure hypercholesterolemia, unspecified: Secondary | ICD-10-CM

## 2020-12-14 HISTORY — DX: Pure hypercholesterolemia, unspecified: E78.00

## 2020-12-14 NOTE — Progress Notes (Signed)
Chief Complaint  Patient presents with   Follow-up    Subjective: Hyperlipidemia Patient presents for Hyperlipidemia follow up. Currently not taking anything.  He was prescribed Lipitor 40 mg/d but did not take it.  He is adhering to a healthy diet. Exercise:  The patient is not known to have coexisting coronary artery disease.  Past Medical History:  Diagnosis Date   Hyperlipidemia 05/26/2018   Ventricular premature depolarization 05/26/2018   On Atenolol    Objective: BP 108/68   Pulse 65   Temp 98.2 F (36.8 C) (Oral)   Ht 5\' 6"  (1.676 m)   Wt 173 lb (78.5 kg)   SpO2 99%   BMI 27.92 kg/m  General: Awake, appears stated age HEENT: MMM Heart: RRR, no LE edema, no bruits Lungs: CTAB, no rales, wheezes or rhonchi. No accessory muscle use Psych: Age appropriate judgment and insight, normal affect and mood  Assessment and Plan: Pure hypercholesterolemia - Plan: Lipid panel  Chronic, unstable. Ck labs. If still elevated, will start back on Lipitor 10 mg/d and reck labs in 6 weeks. Counseled on diet/exercise. Explained that a CACS of 0 is misleading in someone his age as soft plaques likely to predominate. Could revisit this test in his early 15's.  F/u in 6 mo or prn. The patient voiced understanding and agreement to the plan.  [address] [place], DO 12/14/20  3:37 PM

## 2020-12-14 NOTE — Patient Instructions (Signed)
Give us 2-3 business days to get the results of your labs back.   Keep the diet clean and stay active.  Let us know if you need anything. 

## 2020-12-15 LAB — LIPID PANEL
Cholesterol: 202 mg/dL — ABNORMAL HIGH (ref <200)
HDL: 39 mg/dL — ABNORMAL LOW (ref >=40)
LDL Cholesterol (Calc): 133 mg/dL (calc) — ABNORMAL HIGH
Non-HDL Cholesterol (Calc): 163 mg/dL (calc) — ABNORMAL HIGH (ref <130)
Total CHOL/HDL Ratio: 5.2 (calc) — ABNORMAL HIGH (ref <5.0)
Triglycerides: 165 mg/dL — ABNORMAL HIGH (ref <150)

## 2021-01-16 ENCOUNTER — Other Ambulatory Visit: Payer: Self-pay | Admitting: Family Medicine

## 2021-03-14 ENCOUNTER — Ambulatory Visit: Payer: BC Managed Care – PPO | Admitting: Family Medicine

## 2021-03-14 ENCOUNTER — Other Ambulatory Visit: Payer: Self-pay

## 2021-03-14 ENCOUNTER — Encounter: Payer: Self-pay | Admitting: Family Medicine

## 2021-03-14 VITALS — BP 104/62 | HR 51 | Temp 98.2°F | Ht 66.0 in | Wt 171.4 lb

## 2021-03-14 DIAGNOSIS — M25532 Pain in left wrist: Secondary | ICD-10-CM | POA: Diagnosis not present

## 2021-03-14 NOTE — Patient Instructions (Addendum)
Heat (pad or rice pillow in microwave) over affected area, 10-15 minutes twice daily.   Ice/cold pack over area for 10-15 min twice daily.  OK to take Tylenol 1000 mg (2 extra strength tabs) or 975 mg (3 regular strength tabs) every 6 hours as needed.  Wear the brace at night and during aggravating activities.   Let us know if you need anything.  Wrist and Forearm Exercises Do exercises exactly as told by your health care provider and adjust them as directed. It is normal to feel mild stretching, pulling, tightness, or discomfort as you do these exercises, but you should stop right away if you feel sudden pain or your pain gets worse.   RANGE OF MOTION EXERCISES These exercises warm up your muscles and joints and improve the movement and flexibility of your injured wrist and forearm. These exercises also help to relieve pain, numbness, and tingling. These exercises are done using the muscles in your injured wrist and forearm. Exercise A: Wrist Flexion, Active With your fingers relaxed, bend your wrist forward as far as you can. Hold this position for 30 seconds. Repeat 2 times. Complete this exercise 3 times per week. Exercise B: Wrist Extension, Active With your fingers relaxed, bend your wrist backward as far as you can. Hold this position for 30 seconds. Repeat 2 times. Complete this exercise 3 times per week. Exercise C: Supination, Active  Stand or sit with your arms at your sides. Bend your left / right elbow to an "L" shape (90 degrees). Turn your palm upward until you feel a gentle stretch on the inside of your forearm. Hold this position for 30 seconds. Slowly return your palm to the starting position. Repeat 2 times. Complete this exercise 3 times per week. Exercise D: Pronation, Active  Stand or sit with your arms at your sides. Bend your left / right elbow to an "L" shape (90 degrees). Turn your palm downward until you feel a gentle stretch on the top of your  forearm. Hold this position for 30 seconds. Slowly return your palm to the starting position. Repeat 2 times. Complete this exercise once a day.  STRETCHING EXERCISES These exercises warm up your muscles and joints and improve the movement and flexibility of your injured wrist and forearm. These exercises also help to relieve pain, numbness, and tingling. These exercises are done using your healthy wrist and forearm to help stretch the muscles in your injured wrist and forearm. Exercise E: Wrist Flexion, Passive  Extend your left / right arm in front of you, relax your wrist, and point your fingers downward. Gently push on the back of your hand. Stop when you feel a gentle stretch on the top of your forearm. Hold this position for 30 seconds. Repeat 2 times. Complete this exercise 3 times per week. Exercise F: Wrist Extension, Passive  Extend your left / right arm in front of you and turn your palm upward. Gently pull your palm and fingertips back so your fingers point downward. You should feel a gentle stretch on the palm-side of your forearm. Hold this position for 30 seconds. Repeat 2 times. Complete this exercise 3 times per week. Exercise G: Forearm Rotation, Supination, Passive Sit with your left / right elbow bent to an "L" shape (90 degrees) with your forearm resting on a table. Keeping your upper body and shoulder still, use your other hand to rotate your forearm palm-up until you feel a gentle to moderate stretch. Hold this position for 30  seconds. Slowly release the stretch and return to the starting position. Repeat 2 times. Complete this exercise 3 times per week. Exercise H: Forearm Rotation, Pronation, Passive Sit with your left / right elbow bent to an "L" shape (90 degrees) with your forearm resting on a table. Keeping your upper body and shoulder still, use your other hand to rotate your forearm palm-down until you feel a gentle to moderate stretch. Hold this position  for 30 seconds. Slowly release the stretch and return to the starting position. Repeat 2 times. Complete this exercise 3 times per week.  STRENGTHENING EXERCISES These exercises build strength and endurance in your wrist and forearm. Endurance is the ability to use your muscles for a long time, even after they get tired. Exercise I: Wrist Flexors  Sit with your left / right forearm supported on a table and your hand resting palm-up over the edge of the table. Your elbow should be bent to an "L" shape (about 90 degrees) and be below the level of your shoulder. Hold a 3-5 lb weight in your left / right hand. Or, hold a rubber exercise band or tube in both hands, keeping your hands at the same level and hip distance apart. There should be a slight tension in the exercise band or tube. Slowly curl your hand up toward your forearm. Hold this position for 3 seconds. Slowly lower your hand back to the starting position. Repeat 2 times. Complete this exercise 3 times per week. Exercise J: Wrist Extensors  Sit with your left / right forearm supported on a table and your hand resting palm-down over the edge of the table. Your elbow should be bent to an "L" shape (about 90 degrees) and be below the level of your shoulder. Hold a 3-5 lb weight in your left / right hand. Or, hold a rubber exercise band or tube in both hands, keeping your hands at the same level and hip distance apart. There should be a slight tension in the exercise band or tube. Slowly curl your hand up toward your forearm. Hold this position for 3 seconds. Slowly lower your hand back to the starting position. Repeat 2 times. Complete this exercise 3 times per week. Exercise K: Forearm Rotation, Supination  Sit with your left / right forearm supported on a table and your hand resting palm-down. Your elbow should be at your side, bent to an "L" shape (about 90 degrees), and below the level of your shoulder. Keep your wrist stable and in a  neutral position throughout the exercise. Gently hold a lightweight hammer with your left / right hand. Without moving your elbow or wrist, slowly rotate your palm upward to a thumbs-up position. Hold this position for 3 seconds. Slowly return your forearm to the starting position. Repeat 2 times. Complete this exercise 3 times per week. Exercise L: Forearm Rotation, Pronation  Sit with your left / right forearm supported on a table and your hand resting palm-up. Your elbow should be at your side, bent to an "L" shape (about 90 degrees), and below the level of your shoulder. Keep your wrist stable. Do not allow it to move backward or forward during the exercise. Gently hold a lightweight hammer with your left / right hand. Without moving your elbow or wrist, slowly rotate your palm and hand upward to a thumbs-up position. Hold this position for 3 seconds. Slowly return your forearm to the starting position. Repeat 2 times. Complete this exercise 3 times per week. Exercise  M: Grip Strengthening  Hold one of these items in your left / right hand: play dough, therapy putty, a dense sponge, a stress ball, or a large, rolled sock. Squeeze as hard as you can without increasing pain. Hold this position for 5 seconds. Slowly release your grip. Repeat 2 times. Complete this exercise 3 times per week.  This information is not intended to replace advice given to you by your health care provider. Make sure you discuss any questions you have with your health care provider. Document Released: 04/16/2005 Document Revised: 02/25/2016 Document Reviewed: 02/25/2015 Elsevier Interactive Patient Education  [employer].

## 2021-03-14 NOTE — Progress Notes (Signed)
Musculoskeletal Exam  Patient: Jeffery Boyd DOB: [birth year]  DOS: 03/14/2021  SUBJECTIVE:  Chief Complaint:   Chief Complaint  Patient presents with   Arm Pain    Left wrist pain     Jeffery Boyd is a 35 y.o.  male for evaluation and treatment of L wrist pain. He is R handed.   Onset:  3 weeks ago. No inj or change in activity. 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 Does not wake him up at night.   Progression of issue:  had worsened and recently plateaued Associated symptoms: started mid forearm and migrated down to wrist; having pain through the wrist ROM Denies swelling, redness, bruising.  Treatment: to date has been OTC NSAIDS and activity modification.   Neurovascular symptoms: no  Past Medical History:  Diagnosis Date   Hyperlipidemia 05/26/2018   Ventricular premature depolarization 05/26/2018   On Atenolol    Objective: VITAL SIGNS: BP 104/62   Pulse (!) 51   Temp 98.2 F (36.8 C) (Oral)   Ht 5\' 6"  (1.676 m)   Wt 171 lb 6 oz (77.7 kg)   SpO2 98%   BMI 27.66 kg/m  Constitutional: Well formed, well developed. No acute distress. Thorax & Lungs: No accessory muscle use Musculoskeletal: L wrist.   Normal active range of motion: yes.   Normal passive range of motion: yes Tenderness to palpation: yes over distal forearm extensors Deformity: no Ecchymosis: no Neurologic: Normal sensory function. Psychiatric: Normal mood. Age appropriate judgment and insight. Alert & oriented x 3.    Assessment:  Left wrist pain  Plan: Stretches/exercises, heat, ice, Tylenol. Seems like a distal forearm extensor strain. Probably repetitively aggravated by his job which requires typing. Bracing also rec'd at night and during aggravating activities.   F/u prn. The patient voiced understanding and agreement to the plan.   [place], DO 03/14/21  11:36 AM

## 2021-04-17 DIAGNOSIS — M25532 Pain in left wrist: Secondary | ICD-10-CM

## 2021-04-19 ENCOUNTER — Other Ambulatory Visit: Payer: Self-pay

## 2021-04-19 MED ORDER — ATENOLOL 25 MG PO TABS: 25.0000 mg | ORAL_TABLET | Freq: Every day | ORAL | 0 refills | Status: DC

## 2021-04-19 NOTE — Progress Notes (Signed)
A 

## 2021-04-22 ENCOUNTER — Ambulatory Visit: Payer: BC Managed Care – PPO | Admitting: Family Medicine

## 2021-04-22 ENCOUNTER — Ambulatory Visit (INDEPENDENT_AMBULATORY_CARE_PROVIDER_SITE_OTHER): Payer: BC Managed Care – PPO

## 2021-04-22 ENCOUNTER — Encounter: Payer: Self-pay | Admitting: Family Medicine

## 2021-04-22 ENCOUNTER — Other Ambulatory Visit: Payer: Self-pay

## 2021-04-22 ENCOUNTER — Ambulatory Visit: Payer: Self-pay

## 2021-04-22 VITALS — BP 120/70 | HR 59 | Ht 66.0 in | Wt 171.4 lb

## 2021-04-22 DIAGNOSIS — M25532 Pain in left wrist: Secondary | ICD-10-CM | POA: Diagnosis not present

## 2021-04-22 IMAGING — DX DG WRIST COMPLETE 3+V*L*
4 series · 4 of 4 positions shown · non-contrast
Comparison: None.

CLINICAL DATA: Left wrist pain.

EXAM:
LEFT WRIST - COMPLETE 3+ VIEW

[wrist ap]
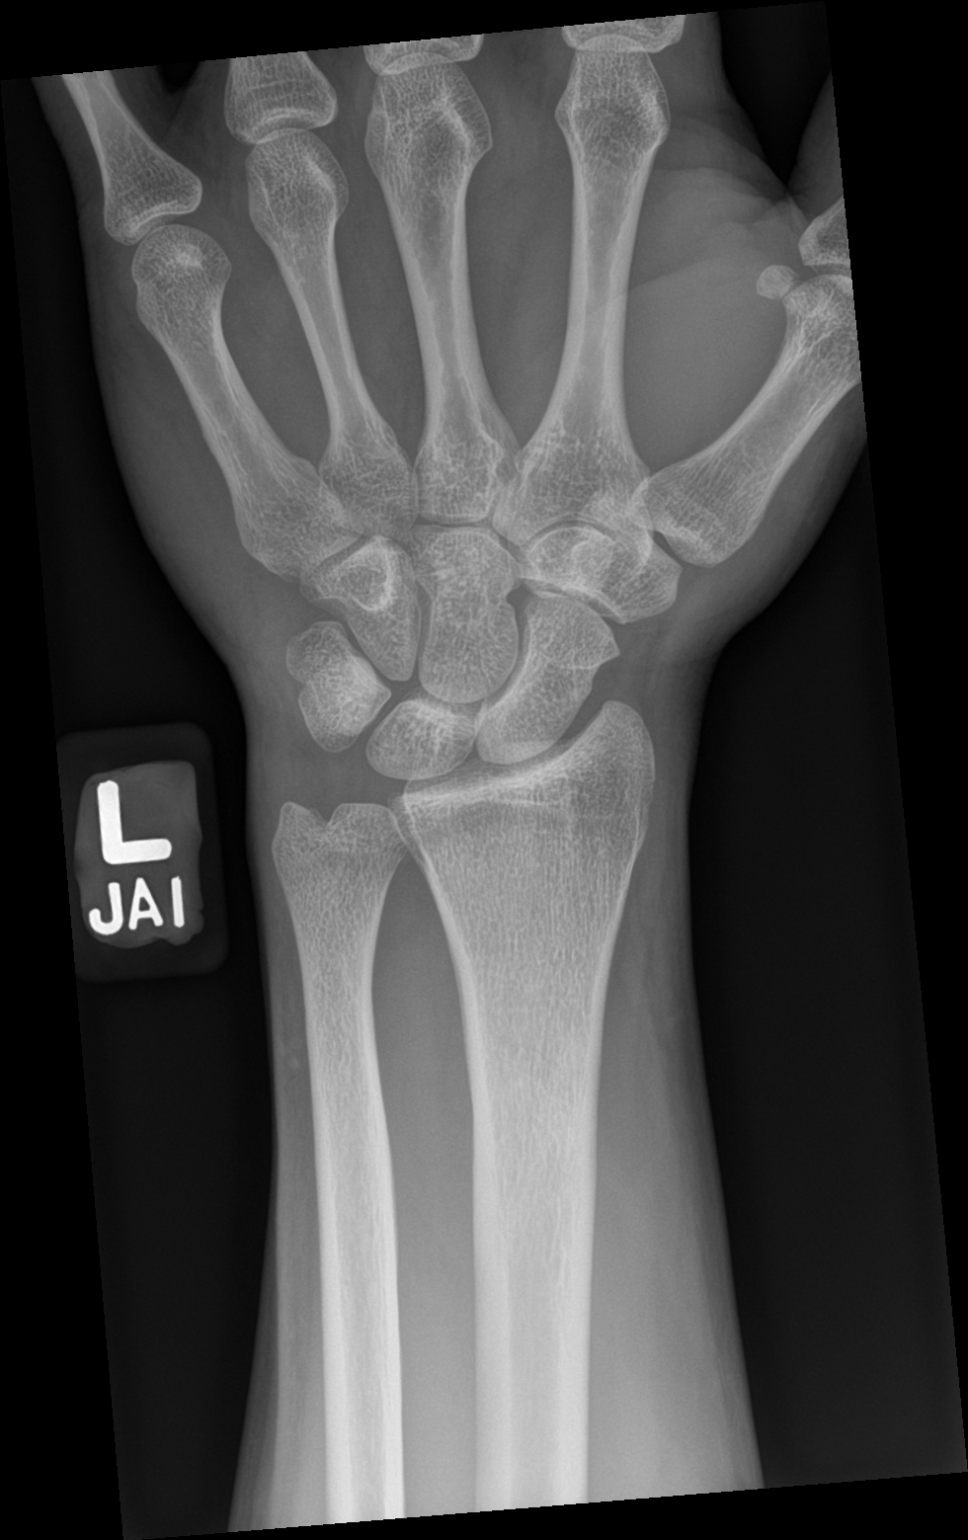

[wrist obl]
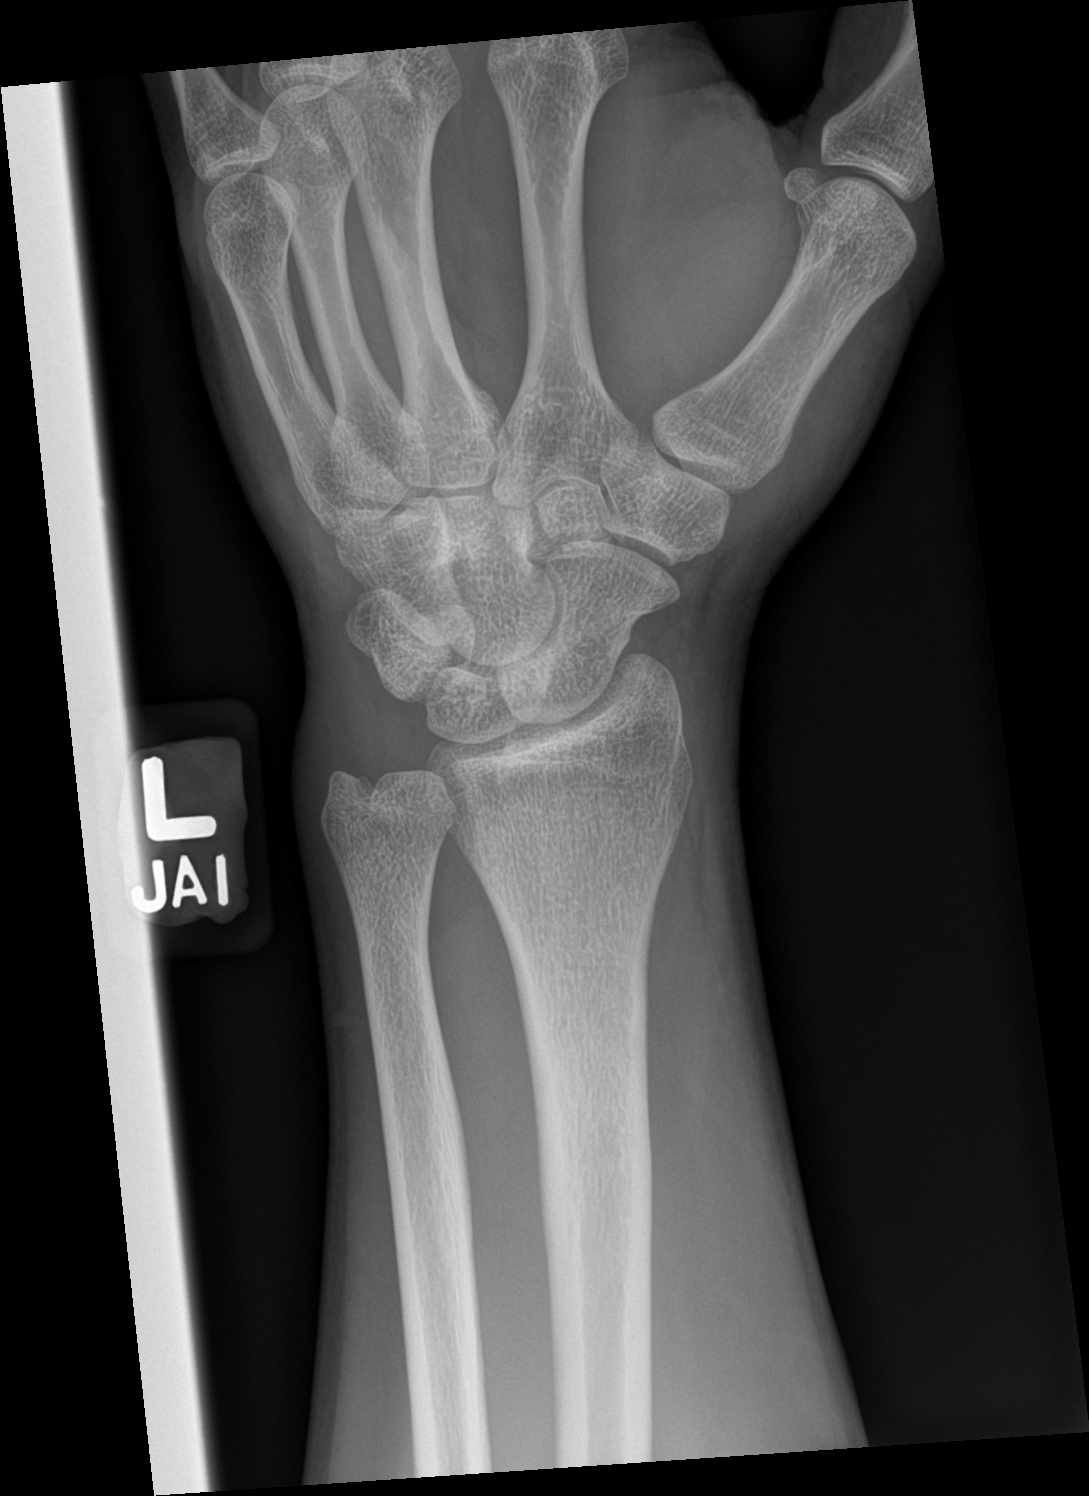

[wrist lat]
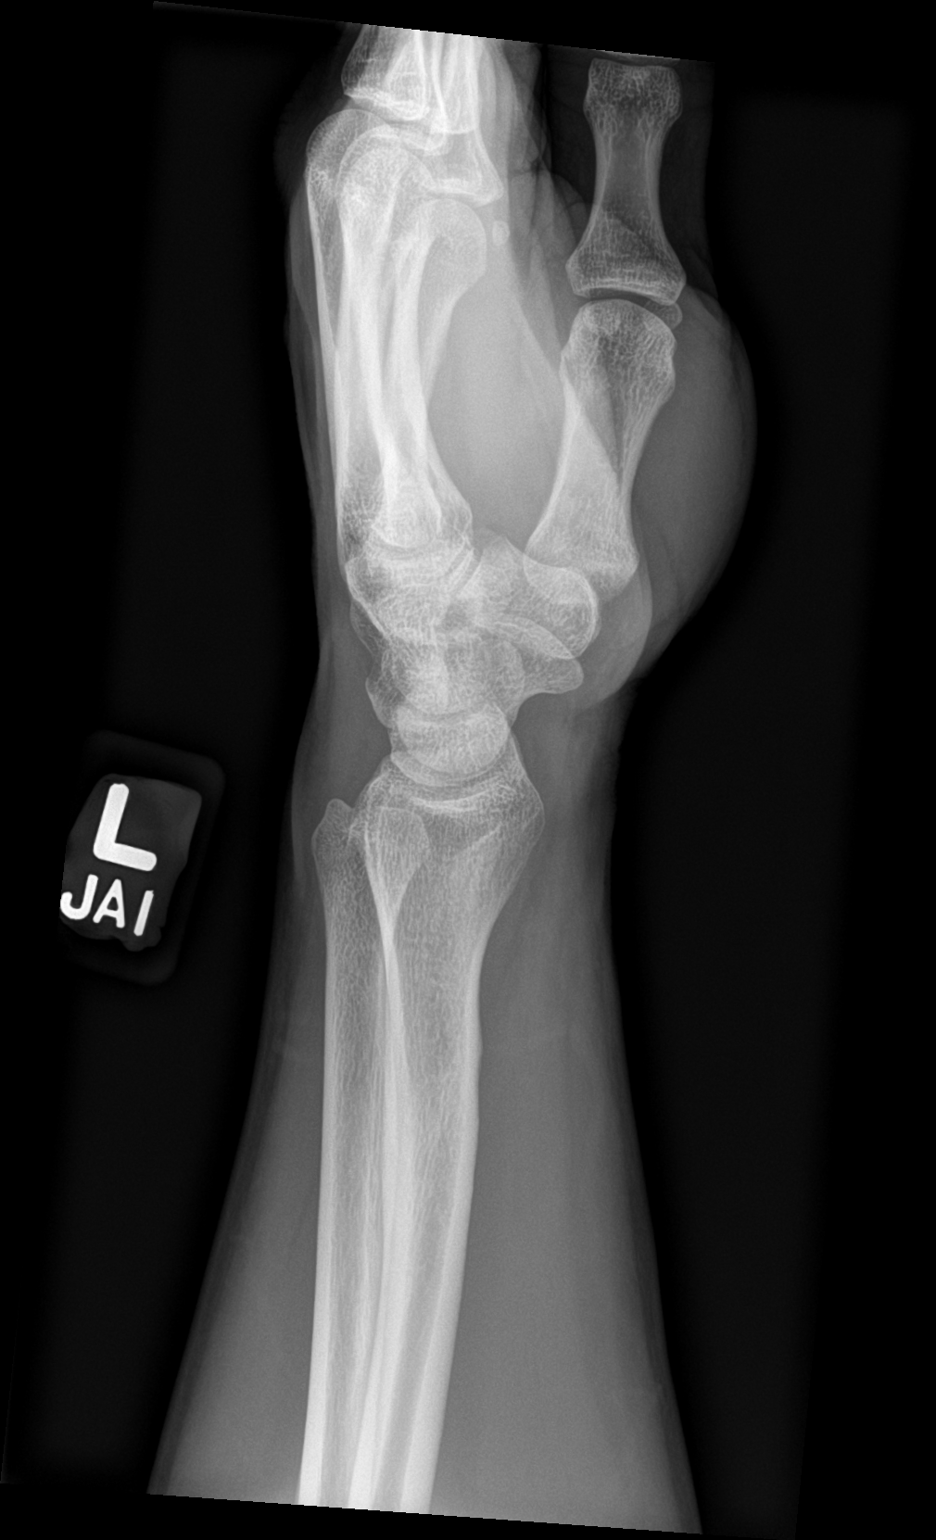

[scaphoid]
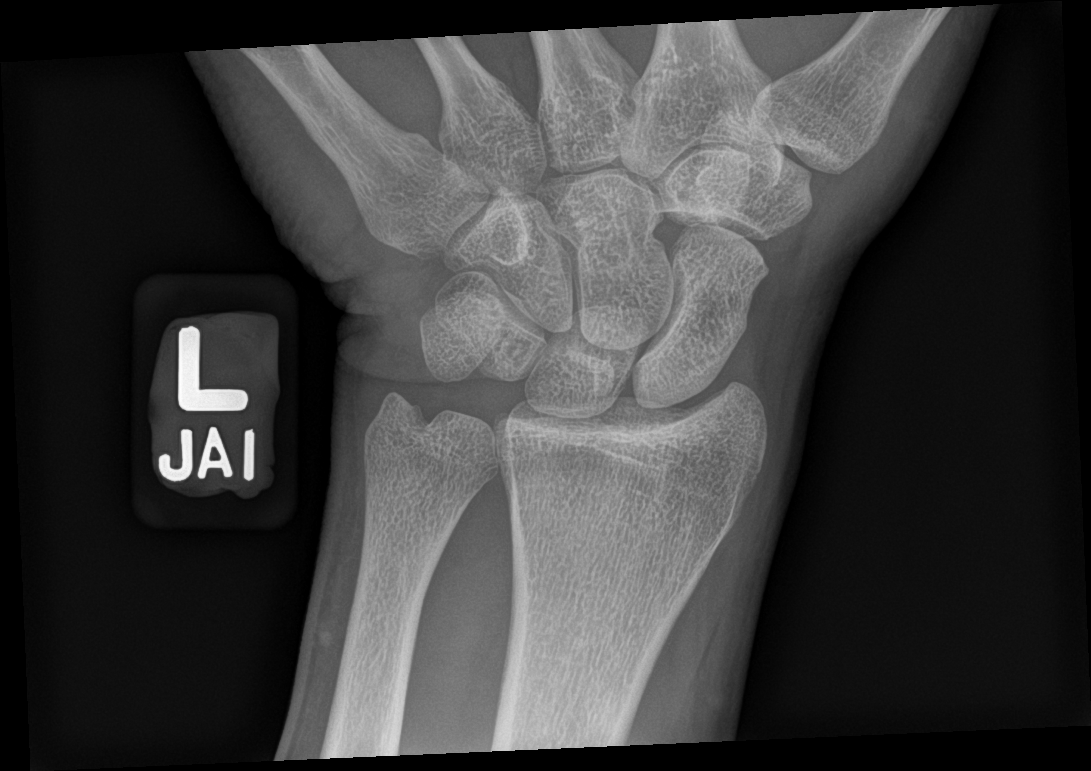

[4 of 4 positions shown; findings below may reference images not displayed]

FINDINGS: There is no evidence of fracture or dislocation. There is no
evidence of arthropathy or other focal bone abnormality. Soft
tissues are unremarkable.
IMPRESSION: Negative.

## 2021-04-22 NOTE — Progress Notes (Signed)
Subjective:    CC: L wrist pain  I, Molly Weber, LAT, ATC, am serving as scribe for Dr. [name].  HPI: Pt is a 35 y/o male presenting w/ c/o L dorsal-sided wrist pain x approximately 6-8 weeks w/ no known MOI.  He locates his pain to his L dorsal, ulnar wrist.  He saw his PCP for these c/o in 03/14/21.  He notes pain with wrist extension.  He has trouble getting up off the ground because he cannot tolerate full wrist extension.  He has to use a closed hand fist to push off the ground.  Additionally he has some discomfort with typing on his laptop keyboard.  L wrist swelling: no L wrist mechanical symptoms: no Radiating pain: yes into into his L forearm Aggravating factors: keyboarding/typing; getting his phone out of his pocket; L wrist ext and RD AROM; L forearm supination AROM Treatments tried: OTC NSAIDs; HEP per PCP; brace;   Pertinent review of Systems: No fevers or chills  Relevant historical information: Hyperlipidemia.   Objective:    Vitals:   04/22/21 0846  BP: 120/70  Pulse: (!) 59  SpO2: 98%   General: Well Developed, well nourished, and in no acute distress.   MSK: Left wrist normal. Mildly tender palpation dorsal ulnar wrist near ulnar styloid. Normal motion pain with maximal wrist extension. Not much pain with resisted wrist extension or ulnar deviation or other wrist motion. Pulses cap refill and sensation are intact distally.  Lab and Radiology Results  Diagnostic Limited MSK Ultrasound of: Left wrist dorsal ECU tendon visualized intact appearing Small hypoechoic fluid tracking within tendon sheath indicating mild tenosynovitis. No effusion dorsal wrist. Bony structures normal-appearing Impression: Probable ECU tenosynovitis  X-ray images left wrist obtained today personally and independently interpreted No acute fractures.  No significant degenerative changes. Await formal radiology review  Impression and Recommendations:    Assessment  and Plan: 35 y.o. male with left wrist pain ongoing for several months without clear injury or obvious cause.  Patient has pain with extension and evidence of ECU tenosynovitis.  He is already had trials of some conservative management with his PCP with immobilization with a wrist brace, NSAIDs, and limited home exercise program.  This unfortunately has not been effective enough and is exactly what I would have done initially.  Next step at this point should be dedicated hand PT.  Plan to refer to hand PT location near where he lives in [place].  There is a benchmark location that offers hand PT in [place].  Will refer there.  Additionally recommend Voltaren gel.  Recheck in about 6 weeks if not better would consider either trial of ECU injection or proceed directly to MRI arthrogram with steroid injection.  PDMP not reviewed this encounter. Orders Placed This Encounter  Procedures   [place] LIMITED JOINT SPACE STRUCTURES UP LEFT(NO LINKED CHARGES)    Order Specific Question:   Reason for Exam (SYMPTOM  OR DIAGNOSIS REQUIRED)    Answer:   L wrist pain    Order Specific Question:   Preferred imaging location?    Answer:   [facility] Wrist Complete Left    Standing Status:   Future    Number of Occurrences:   1    Standing Expiration Date:   05/22/2021    Order Specific Question:   Reason for Exam (SYMPTOM  OR DIAGNOSIS REQUIRED)    Answer:   L wrist pain  Order Specific Question:   Preferred imaging location?    Answer:   [facility]   Ambulatory referral to Physical Therapy    Referral Priority:   Routine    Referral Type:   Physical Medicine    Referral Reason:   Specialty Services Required    Requested Specialty:   Physical Therapy    Number of Visits Requested:   1   No orders of the defined types were placed in this encounter.   Discussed warning signs or symptoms. Please see discharge instructions. Patient expresses understanding.   The  above documentation has been reviewed and is accurate and complete [name], M.D.

## 2021-04-22 NOTE — Patient Instructions (Addendum)
Nice to meet you today.  Please get an Xray today before you leave.  Con't wearing your wrist brace w/ aggravating activities.  Please use Voltaren gel (Generic Diclofenac Gel) up to 4x daily for pain as needed.  This is available over-the-counter as both the name brand Voltaren gel and the generic diclofenac gel.   Hand PT  [phone]   Follow-up: 6 weeks.   Let me know if this is not working.

## 2021-04-23 NOTE — Progress Notes (Signed)
Left wrist x-ray looks normal to radiology

## 2021-04-27 DIAGNOSIS — J029 Acute pharyngitis, unspecified: Secondary | ICD-10-CM | POA: Diagnosis not present

## 2021-04-30 ENCOUNTER — Encounter: Payer: Self-pay | Admitting: Family Medicine

## 2021-04-30 DIAGNOSIS — M25432 Effusion, left wrist: Secondary | ICD-10-CM | POA: Diagnosis not present

## 2021-04-30 DIAGNOSIS — M25532 Pain in left wrist: Secondary | ICD-10-CM | POA: Diagnosis not present

## 2021-04-30 DIAGNOSIS — M25632 Stiffness of left wrist, not elsewhere classified: Secondary | ICD-10-CM | POA: Diagnosis not present

## 2021-04-30 DIAGNOSIS — M6281 Muscle weakness (generalized): Secondary | ICD-10-CM | POA: Diagnosis not present

## 2021-05-02 ENCOUNTER — Encounter: Payer: Self-pay | Admitting: Medical

## 2021-05-02 ENCOUNTER — Other Ambulatory Visit (HOSPITAL_BASED_OUTPATIENT_CLINIC_OR_DEPARTMENT_OTHER): Payer: Self-pay

## 2021-05-02 ENCOUNTER — Telehealth (INDEPENDENT_AMBULATORY_CARE_PROVIDER_SITE_OTHER): Payer: BC Managed Care – PPO | Admitting: Medical

## 2021-05-02 VITALS — HR 54

## 2021-05-02 DIAGNOSIS — M25532 Pain in left wrist: Secondary | ICD-10-CM | POA: Diagnosis not present

## 2021-05-02 DIAGNOSIS — M25632 Stiffness of left wrist, not elsewhere classified: Secondary | ICD-10-CM | POA: Diagnosis not present

## 2021-05-02 DIAGNOSIS — R058 Other specified cough: Secondary | ICD-10-CM | POA: Diagnosis not present

## 2021-05-02 DIAGNOSIS — M25432 Effusion, left wrist: Secondary | ICD-10-CM | POA: Diagnosis not present

## 2021-05-02 DIAGNOSIS — R0981 Nasal congestion: Secondary | ICD-10-CM

## 2021-05-02 DIAGNOSIS — J029 Acute pharyngitis, unspecified: Secondary | ICD-10-CM | POA: Diagnosis not present

## 2021-05-02 DIAGNOSIS — M6281 Muscle weakness (generalized): Secondary | ICD-10-CM | POA: Diagnosis not present

## 2021-05-02 MED ORDER — FLUTICASONE PROPIONATE 50 MCG/ACT NA SUSP
2.0000 | Freq: Every day | NASAL | 1 refills | Status: DC
  Filled 2021-05-02: qty 16, 30d supply, fill #0

## 2021-05-02 MED ORDER — AZITHROMYCIN 250 MG PO TABS: ORAL_TABLET | ORAL | 0 refills | Status: DC

## 2021-05-02 MED ORDER — AZITHROMYCIN 250 MG PO TABS
ORAL_TABLET | ORAL | 0 refills | Status: AC
  Filled 2021-05-02: qty 6, 5d supply, fill #0

## 2021-05-02 MED ORDER — BENZONATATE 100 MG PO CAPS: 100.0000 mg | ORAL_CAPSULE | Freq: Three times a day (TID) | ORAL | 0 refills | Status: DC | PRN

## 2021-05-02 MED ORDER — BENZONATATE 100 MG PO CAPS
100.0000 mg | ORAL_CAPSULE | Freq: Three times a day (TID) | ORAL | 0 refills | Status: DC | PRN
  Filled 2021-05-02: qty 30, 10d supply, fill #0

## 2021-05-02 MED ORDER — FLUTICASONE PROPIONATE 50 MCG/ACT NA SUSP: 2.0000 | Freq: Every day | NASAL | 1 refills | Status: DC

## 2021-05-02 NOTE — Patient Instructions (Addendum)
Pharyngitis for one week with nasal congestion and occasional cough. Video visit so unable to fully assess. Rx azithromycin to cover for strep and sinus infection. Also benzonate for cough and flonase for nasal congestion.  If signs and symptoms worsen or change let us know.  Follow up in 7 days or sooner if needed.

## 2021-05-02 NOTE — Progress Notes (Signed)
   Subjective:    Patient ID: Jeffery Boyd, male    DOB: 03/01/1986, 35 y.o.   MRN: [id number]  HPI Virtual Visit via Video Note  I connected with Jeffery Boyd on 05/02/21 at 11:00 AM EST by a video enabled telemedicine application and verified that I am speaking with the correct person using two identifiers.  Location: Patient: home Provider: office   I discussed the limitations of evaluation and management by telemedicine and the availability of in person appointments. The patient expressed understanding and agreed to proceed.  History of Present Illness: Pt states last Friday he had st. He went to urgent care. He had covid test and strep test. Both test results came back in 10 mintues and were negative. Pt never got flu test.   Pt has been taking dayquill, nyqil and mucinex.    Pt states still mild sore. Now having some cough that is dry.  Pt never developed myaglias, fever, chills or sweats.  He has some pnd present. In morning when clears throat does get colored mucus.  Not having any sneezing.    Observations/Objective: General-no acute distress, pleasant, oriented. Lungs- on inspection lungs appear unlabored. Neck- no tracheal deviation or jvd on inspection. Neuro- gross motor function appears intact.  Heent- no pain on self palpation  Assessment and Plan: Patient Instructions  Pharyngitis for one week with nasal congestion and occasional cough. Video visit so unable to fully assess. Rx azithromycin to cover for strep and sinus infection. Also benzonate for cough and flonase for nasal congestion.  If signs and symptoms worsen or change let us know.  Follow up in 7 days or sooner if needed.    Time spent with patient today was 15  minutes which consisted of chart review, discussing diagnosis, treatment and documentation.  Follow Up Instructions:    I discussed the assessment and treatment plan with the patient. The patient was provided an opportunity to ask  questions and all were answered. The patient agreed with the plan and demonstrated an understanding of the instructions.   The patient was advised to call back or seek an in-person evaluation if the symptoms worsen or if the condition fails to improve as anticipated.     Jeffery Richters, PA-C      Review of Systems     Objective:   Physical Exam        Assessment & Plan:

## 2021-05-06 DIAGNOSIS — M25532 Pain in left wrist: Secondary | ICD-10-CM | POA: Diagnosis not present

## 2021-05-06 DIAGNOSIS — M25432 Effusion, left wrist: Secondary | ICD-10-CM | POA: Diagnosis not present

## 2021-05-06 DIAGNOSIS — M25632 Stiffness of left wrist, not elsewhere classified: Secondary | ICD-10-CM | POA: Diagnosis not present

## 2021-05-06 DIAGNOSIS — M6281 Muscle weakness (generalized): Secondary | ICD-10-CM | POA: Diagnosis not present

## 2021-05-08 DIAGNOSIS — M25532 Pain in left wrist: Secondary | ICD-10-CM | POA: Diagnosis not present

## 2021-05-08 DIAGNOSIS — M6281 Muscle weakness (generalized): Secondary | ICD-10-CM | POA: Diagnosis not present

## 2021-05-08 DIAGNOSIS — M25632 Stiffness of left wrist, not elsewhere classified: Secondary | ICD-10-CM | POA: Diagnosis not present

## 2021-05-08 DIAGNOSIS — M25432 Effusion, left wrist: Secondary | ICD-10-CM | POA: Diagnosis not present

## 2021-05-15 DIAGNOSIS — M25432 Effusion, left wrist: Secondary | ICD-10-CM | POA: Diagnosis not present

## 2021-05-15 DIAGNOSIS — M25632 Stiffness of left wrist, not elsewhere classified: Secondary | ICD-10-CM | POA: Diagnosis not present

## 2021-05-15 DIAGNOSIS — M6281 Muscle weakness (generalized): Secondary | ICD-10-CM | POA: Diagnosis not present

## 2021-05-15 DIAGNOSIS — M25532 Pain in left wrist: Secondary | ICD-10-CM | POA: Diagnosis not present

## 2021-05-17 DIAGNOSIS — M25432 Effusion, left wrist: Secondary | ICD-10-CM | POA: Diagnosis not present

## 2021-05-17 DIAGNOSIS — M25532 Pain in left wrist: Secondary | ICD-10-CM | POA: Diagnosis not present

## 2021-05-17 DIAGNOSIS — M25632 Stiffness of left wrist, not elsewhere classified: Secondary | ICD-10-CM | POA: Diagnosis not present

## 2021-05-17 DIAGNOSIS — M6281 Muscle weakness (generalized): Secondary | ICD-10-CM | POA: Diagnosis not present

## 2021-05-23 ENCOUNTER — Other Ambulatory Visit: Payer: Self-pay

## 2021-05-23 ENCOUNTER — Ambulatory Visit: Payer: BC Managed Care – PPO | Admitting: Cardiology

## 2021-05-23 ENCOUNTER — Encounter: Payer: Self-pay | Admitting: Cardiology

## 2021-05-23 VITALS — BP 112/72 | HR 69 | Ht 66.0 in | Wt 172.0 lb

## 2021-05-23 DIAGNOSIS — I493 Ventricular premature depolarization: Secondary | ICD-10-CM

## 2021-05-23 DIAGNOSIS — E782 Mixed hyperlipidemia: Secondary | ICD-10-CM | POA: Diagnosis not present

## 2021-05-23 NOTE — Progress Notes (Signed)
Cardiology Office Note:    Date:  05/23/2021   ID:  Jeffery Boyd, DOB Feb 24, 1986, MRN [id number]  PCP:  Sharlene Dory, DO  Cardiologist:  Norman Herrlich, MD    Referring MD: Sharlene Dory*    ASSESSMENT:    1. Ventricular premature depolarization   2. Mixed hyperlipidemia    PLAN:    In order of problems listed above:  He had a flare of symptomatic PVCs back on a beta-blocker I told him I take it daily for about 2 weeks and then as needed.  Encouraged him to get back to his regular exercise programs been very effective in suppressing arrhythmia in the past Currently not on lipid-lowering therapy lipid profile 12/14/2020 cholesterol 202 LDL 133.   Next appointment: 1 year    Medication Adjustments/Labs and Tests Ordered: Current medicines are reviewed at length with the patient today.  Concerns regarding medicines are outlined above.  Orders Placed This Encounter  Procedures   EKG 12-Lead   No orders of the defined types were placed in this encounter.   Chief Complaint  Patient presents with   Palpitations    History of Present Illness:    Jeffery Boyd is a 35 y.o. male with a hx of symptomatic PVCs last seen 06/23/2019.  Previous evaluation included stress echo July 2019 which was normal low risk.  Review of records shows that previous echocardiogram was normal in 2017 he has a longstanding history of PVCs treated with a beta-blocker.  Compliance with diet, lifestyle and medications: Yes  He had been off a beta-blocker for about a year and a half unfortunately with a new baby he is less active not running every day he started to notice palpitation again with sensation of fluttering called was put back on a beta-blocker and improved He has a fit bit we went through and looked at his device he has had no high or low rate alerts and no alert for irregular heart rhythm. No chest pain edema shortness of breath or syncope Past Medical History:  Diagnosis  Date   Hyperlipidemia 05/26/2018   Ventricular premature depolarization 05/26/2018   On Atenolol    Past Surgical History:  Procedure Laterality Date   WISDOM TOOTH EXTRACTION      Current Medications: Current Meds  Medication Sig   atenolol (TENORMIN) 25 MG tablet Take 1 tablet (25 mg total) by mouth daily.   valACYclovir (VALTREX) 1000 MG tablet Take 2 tabs and repeat in 12 hrs for cold sores.     Allergies:   Patient has no known allergies.   Social History   Socioeconomic History   Marital status: Married    Spouse name: Not on file   Number of children: Not on file   Years of education: Not on file   Highest education level: Not on file  Occupational History   Not on file  Tobacco Use   Smoking status: Never    Passive exposure: Never   Smokeless tobacco: Never  Vaping Use   Vaping Use: Never used  Substance and Sexual Activity   Alcohol use: Yes    Comment: occ   Drug use: Not Currently   Sexual activity: Not on file  Other Topics Concern   Not on file  Social History Narrative   Not on file   Social Determinants of Health   Financial Resource Strain: Not on file  Food Insecurity: Not on file  Transportation Needs: Not on file  Physical Activity: Not on  file  Stress: Not on file  Social Connections: Not on file     Family History: The patient's family history includes Asthma in his mother; Heart attack (age of onset: 99) in his paternal uncle; Hyperlipidemia in his father; Hypertension in his father. ROS:   Please see the history of present illness.    All other systems reviewed and are negative.  EKGs/Labs/Other Studies Reviewed:    The following studies were reviewed today:  EKG:  EKG ordered today and personally reviewed.  The ekg ordered today demonstrates sinus rhythm and is normal  Recent Labs: 06/12/2020: ALT 13; BUN 16; Creatinine, Ser 1.22; Potassium 4.1; Sodium 139  Recent Lipid Panel    Component Value Date/Time   CHOL 202 (H)  12/14/2020 1510   TRIG 165 (H) 12/14/2020 1510   HDL 39 (L) 12/14/2020 1510   CHOLHDL 5.2 (H) 12/14/2020 1510   VLDL 19.0 06/12/2020 0825   LDLCALC 133 (H) 12/14/2020 1510    Physical Exam:    VS:  BP 112/72   Pulse 69   Ht 5\' 6"  (1.676 m)   Wt 172 lb (78 kg)   SpO2 99%   BMI 27.76 kg/m     Wt Readings from Last 3 Encounters:  05/23/21 172 lb (78 kg)  04/22/21 171 lb 6.4 oz (77.7 kg)  03/14/21 171 lb 6 oz (77.7 kg)     GEN:  Well nourished, well developed in no acute distress HEENT: Normal NECK: No JVD; No carotid bruits LYMPHATICS: No lymphadenopathy CARDIAC: RRR, no murmurs, rubs, gallops RESPIRATORY:  Clear to auscultation without rales, wheezing or rhonchi  ABDOMEN: Soft, non-tender, non-distended MUSCULOSKELETAL:  No edema; No deformity  SKIN: Warm and dry NEUROLOGIC:  Alert and oriented x 3 PSYCHIATRIC:  Normal affect    Signed, 03/16/21, MD  05/23/2021 4:15 PM    [facility]

## 2021-05-23 NOTE — Patient Instructions (Signed)

## 2021-05-24 DIAGNOSIS — M25632 Stiffness of left wrist, not elsewhere classified: Secondary | ICD-10-CM | POA: Diagnosis not present

## 2021-05-24 DIAGNOSIS — M25532 Pain in left wrist: Secondary | ICD-10-CM | POA: Diagnosis not present

## 2021-05-24 DIAGNOSIS — M6281 Muscle weakness (generalized): Secondary | ICD-10-CM | POA: Diagnosis not present

## 2021-05-28 DIAGNOSIS — M25532 Pain in left wrist: Secondary | ICD-10-CM | POA: Diagnosis not present

## 2021-05-28 DIAGNOSIS — M25632 Stiffness of left wrist, not elsewhere classified: Secondary | ICD-10-CM | POA: Diagnosis not present

## 2021-05-28 DIAGNOSIS — M6281 Muscle weakness (generalized): Secondary | ICD-10-CM | POA: Diagnosis not present

## 2021-05-30 DIAGNOSIS — M6281 Muscle weakness (generalized): Secondary | ICD-10-CM | POA: Diagnosis not present

## 2021-05-30 DIAGNOSIS — M25632 Stiffness of left wrist, not elsewhere classified: Secondary | ICD-10-CM | POA: Diagnosis not present

## 2021-05-30 DIAGNOSIS — M25532 Pain in left wrist: Secondary | ICD-10-CM | POA: Diagnosis not present

## 2021-06-03 DIAGNOSIS — M25632 Stiffness of left wrist, not elsewhere classified: Secondary | ICD-10-CM | POA: Diagnosis not present

## 2021-06-03 DIAGNOSIS — M6281 Muscle weakness (generalized): Secondary | ICD-10-CM | POA: Diagnosis not present

## 2021-06-03 DIAGNOSIS — M25532 Pain in left wrist: Secondary | ICD-10-CM | POA: Diagnosis not present

## 2021-06-05 ENCOUNTER — Ambulatory Visit: Payer: Self-pay

## 2021-06-05 ENCOUNTER — Other Ambulatory Visit: Payer: Self-pay

## 2021-06-05 ENCOUNTER — Ambulatory Visit: Payer: BC Managed Care – PPO | Admitting: Family Medicine

## 2021-06-05 VITALS — BP 98/66 | HR 54 | Ht 66.0 in | Wt 173.8 lb

## 2021-06-05 DIAGNOSIS — M25532 Pain in left wrist: Secondary | ICD-10-CM | POA: Diagnosis not present

## 2021-06-05 DIAGNOSIS — M67432 Ganglion, left wrist: Secondary | ICD-10-CM

## 2021-06-05 NOTE — Patient Instructions (Addendum)
Thank you for coming in today.   Finish out physical therapy, let me know if not better and I will order an MRI  Let me know if you are not continuing to improve

## 2021-06-05 NOTE — Progress Notes (Addendum)
° °  I, [name], LAT, ATC acting as a scribe for [name], MD.  Jeffery Boyd is a 35 y.o. male who presents to [facility] today for L wrist pain thought to be due to ECU tenosynovitis. Pt was last seen by Dr. Denyse Amass on 04/22/21 and was referred to [facility] PT is [facility] and was advised to use Voltaren gel. Today, pt reports some improvement in his wrist pain, 70-75%. Pt notes OT is treating more proximally into the forearm and elbow, which has been helpful.  Dx imaging: 04/22/21 L wrist XR  Pertinent review of systems: No fevers or chills  Relevant historical information: Hypercholesterolemia   Exam:  BP 98/66    Pulse (!) 54    Ht 5\' 6"  (1.676 m)    Wt 173 lb 12.8 oz (78.8 kg)    SpO2 97%    BMI 28.05 kg/m  General: Well Developed, well nourished, and in no acute distress.   MSK: Left wrist normal. Tiny nodule overlying ECU tendon palpable nontender. Wrist extension range of motion significantly limited.  Tender palpation at TFCC.    Lab and Radiology Results  Diagnostic Limited MSK Ultrasound of: Left wrist nodule at ECU tendon and left wrist TFCC. Palpable nodule visualized with ultrasound showing tiny loculated ganglion cyst measuring less than 1 mm in height and 1 to 3 mm with sitting on top of the ECU tendon. TFCC is abnormal appearing with hypoechoic change within the mid substance of TFCC consistent with a partial tear possibly. Impression: Tiny ganglion cyst at ECU tendon and possible TFCC tear.      Assessment and Plan: 35 y.o. male with wrist discomfort and lack of range of motion.  This is now improving some with physical therapy focused on stretching and treatment of the forearm musculature.  However he very likely also has a TFCC injury.  If not doing well with further physical therapy for another 2 to 4 weeks he will notify me and we should proceed to MRI arthrogram.  I can order this with a phone call or MyChart  message.  The ganglion cyst is tiny and I do not think contributing to his discomfort.  Watchful waiting for now.   Addendum: Patient contacted me on 06/11/2021 His wrist pain has significantly worsened.  He continues have significant mechanical symptoms and limitation in range of motion.  Proceed to MRI arthrogram.   PDMP not reviewed this encounter. Orders Placed This Encounter  Procedures   06/13/2021 LIMITED JOINT SPACE STRUCTURES UP LEFT(NO LINKED CHARGES)    Standing Status:   Future    Number of Occurrences:   1    Standing Expiration Date:   12/04/2021    Order Specific Question:   Reason for Exam (SYMPTOM  OR DIAGNOSIS REQUIRED)    Answer:   left wrist pain    Order Specific Question:   Preferred imaging location?    Answer:   [facility]   No orders of the defined types were placed in this encounter.    Discussed warning signs or symptoms. Please see discharge instructions. Patient expresses understanding.   The above documentation has been reviewed and is accurate and complete 12/06/2021, M.D.

## 2021-06-06 DIAGNOSIS — M25632 Stiffness of left wrist, not elsewhere classified: Secondary | ICD-10-CM | POA: Diagnosis not present

## 2021-06-06 DIAGNOSIS — M6281 Muscle weakness (generalized): Secondary | ICD-10-CM | POA: Diagnosis not present

## 2021-06-06 DIAGNOSIS — M25532 Pain in left wrist: Secondary | ICD-10-CM | POA: Diagnosis not present

## 2021-06-11 ENCOUNTER — Encounter: Payer: Self-pay | Admitting: Family Medicine

## 2021-06-11 DIAGNOSIS — M25532 Pain in left wrist: Secondary | ICD-10-CM

## 2021-06-12 DIAGNOSIS — M25532 Pain in left wrist: Secondary | ICD-10-CM | POA: Diagnosis not present

## 2021-06-12 DIAGNOSIS — M6281 Muscle weakness (generalized): Secondary | ICD-10-CM | POA: Diagnosis not present

## 2021-06-12 DIAGNOSIS — M25632 Stiffness of left wrist, not elsewhere classified: Secondary | ICD-10-CM | POA: Diagnosis not present

## 2021-06-14 ENCOUNTER — Encounter: Payer: Self-pay | Admitting: Family Medicine

## 2021-06-14 ENCOUNTER — Ambulatory Visit (INDEPENDENT_AMBULATORY_CARE_PROVIDER_SITE_OTHER): Payer: BC Managed Care – PPO | Admitting: Family Medicine

## 2021-06-14 ENCOUNTER — Other Ambulatory Visit: Payer: Self-pay | Admitting: Family Medicine

## 2021-06-14 VITALS — BP 104/68 | HR 61 | Temp 98.2°F | Ht 66.0 in | Wt 174.0 lb

## 2021-06-14 DIAGNOSIS — E7800 Pure hypercholesterolemia, unspecified: Secondary | ICD-10-CM

## 2021-06-14 DIAGNOSIS — Z23 Encounter for immunization: Secondary | ICD-10-CM

## 2021-06-14 DIAGNOSIS — Z0000 Encounter for general adult medical examination without abnormal findings: Secondary | ICD-10-CM | POA: Diagnosis not present

## 2021-06-14 LAB — COMPREHENSIVE METABOLIC PANEL
ALT: 12 U/L (ref 0–53)
AST: 18 U/L (ref 0–37)
Albumin: 4.5 g/dL (ref 3.5–5.2)
Alkaline Phosphatase: 49 U/L (ref 39–117)
BUN: 19 mg/dL (ref 6–23)
CO2: 31 mEq/L (ref 19–32)
Calcium: 9.7 mg/dL (ref 8.4–10.5)
Chloride: 103 mEq/L (ref 96–112)
Creatinine, Ser: 1.28 mg/dL (ref 0.40–1.50)
GFR: 72.54 mL/min (ref >60.00)
Glucose, Bld: 68 mg/dL — ABNORMAL LOW (ref 70–99)
Potassium: 4.1 mEq/L (ref 3.5–5.1)
Sodium: 140 mEq/L (ref 135–145)
Total Bilirubin: 0.7 mg/dL (ref 0.2–1.2)
Total Protein: 6.6 g/dL (ref 6.0–8.3)

## 2021-06-14 LAB — LIPID PANEL
Cholesterol: 244 mg/dL — ABNORMAL HIGH (ref 0–200)
HDL: 48.8 mg/dL (ref >39.00)
LDL Cholesterol: 177 mg/dL — ABNORMAL HIGH (ref 0–99)
NonHDL: 195.61
Total CHOL/HDL Ratio: 5
Triglycerides: 93 mg/dL (ref 0.0–149.0)
VLDL: 18.6 mg/dL (ref 0.0–40.0)

## 2021-06-14 LAB — CBC
HCT: 45.6 % (ref 39.0–52.0)
Hemoglobin: 15.3 g/dL (ref 13.0–17.0)
MCHC: 33.5 g/dL (ref 30.0–36.0)
MCV: 87.8 fl (ref 78.0–100.0)
Platelets: 222 10*3/uL (ref 150.0–400.0)
RBC: 5.2 Mil/uL (ref 4.22–5.81)
RDW: 13.3 % (ref 11.5–15.5)
WBC: 5.2 10*3/uL (ref 4.0–10.5)

## 2021-06-14 NOTE — Progress Notes (Signed)
Chief Complaint  Patient presents with   Annual Exam    Sore throat     [name] is here for a complete physical.   His last physical was >1 year ago.  Current diet: in general, a "healthy" diet.   Current exercise: less so lately Weight trend: stable Fatigue out of ordinary? No, just had a child in Oct Seat belt? Yes.   Advanced directive? No  Health maintenance Tetanus- Yes HIV- Yes Hep C- Yes  Past Medical History:  Diagnosis Date   Hyperlipidemia 05/26/2018   Ventricular premature depolarization 05/26/2018   On Atenolol     Past Surgical History:  Procedure Laterality Date   WISDOM TOOTH EXTRACTION      Medications  Current Outpatient Medications on File Prior to Visit  Medication Sig Dispense Refill   atenolol (TENORMIN) 25 MG tablet Take 1 tablet (25 mg total) by mouth daily. 60 tablet 0   valACYclovir (VALTREX) 1000 MG tablet Take 2 tabs and repeat in 12 hrs for cold sores. 30 tablet 1   Allergies No Known Allergies  Family History Family History  Problem Relation Age of Onset   Hypertension Father    Hyperlipidemia Father    Heart attack Paternal Uncle 20   Asthma Mother     Review of Systems: Constitutional: no fevers or chills Eye:  no recent significant change in vision Ear/Nose/Mouth/Throat:  Ears:  no hearing loss Nose/Mouth/Throat:  no complaints of nasal congestion Cardiovascular:  no chest pain Respiratory:  no shortness of breath Gastrointestinal:  no abdominal pain, no change in bowel habits GU:  Male: negative for dysuria Musculoskeletal/Extremities:  no new pain of the joints Integumentary (Skin/Breast):  no abnormal skin lesions reported Neurologic:  no headaches Endocrine: No unexpected weight changes Hematologic/Lymphatic:  no night sweats  Exam BP 104/68    Pulse 61    Temp 98.2 F (36.8 C) (Oral)    Ht 5\' 6"  (1.676 m)    Wt 174 lb (78.9 kg)    SpO2 97%    BMI 28.08 kg/m  General:  well developed, well  nourished, in no apparent distress Skin:  no significant moles, warts, or growths Head:  no masses, lesions, or tenderness Eyes:  pupils equal and round, sclera anicteric without injection Ears:  canals without lesions, TMs shiny without retraction, no obvious effusion, no erythema Nose:  nares patent, septum midline, mucosa normal Throat/Pharynx:  lips and gingiva without lesion; tongue and uvula midline; non-inflamed pharynx; no exudates or postnasal drainage Neck: neck supple without adenopathy, thyromegaly, or masses Lungs:  clear to auscultation, breath sounds equal bilaterally, no respiratory distress Cardio:  regular rate and rhythm, no bruits, no LE edema Abdomen:  abdomen soft, nontender; bowel sounds normal; no masses or organomegaly Genital (male): Deferred Rectal: Deferred Musculoskeletal:  symmetrical muscle groups noted without atrophy or deformity Extremities:  no clubbing, cyanosis, or edema, no deformities, no skin discoloration Neuro:  gait normal; deep tendon reflexes normal and symmetric Psych: well oriented with normal range of affect and appropriate judgment/insight  Assessment and Plan  Well adult exam - Plan: CBC, Comprehensive metabolic panel, Lipid panel   Well 35 y.o. male. Counseled on diet and exercise. Self testicular exams recommended at least monthly.  Other orders as above. Flu shot today.  Bivalent COVID vaccination booster recommended.  Advanced directive form provided today.  Follow up in 1 year pending the above workup. The patient voiced understanding and agreement to the plan.  31  Armen Waring, DO 06/14/21 8:42 AM

## 2021-06-14 NOTE — Patient Instructions (Signed)
Give [place] 2-3 business days to get the results of your labs back.   Keep the diet clean and stay active.  Congratulations on your latest addition!  Do monthly self testicular checks in the shower. You are feeling for lumps/bumps that don't belong. If you feel anything like this, let me know!  I recommend getting the updated bivalent covid vaccination booster at your convenience.   Let us know if you need anything.

## 2021-06-14 NOTE — Addendum Note (Signed)
Addended by: Scharlene Gloss B on: 06/14/2021 08:54 AM   Modules accepted: Orders

## 2021-06-19 DIAGNOSIS — M25532 Pain in left wrist: Secondary | ICD-10-CM | POA: Diagnosis not present

## 2021-06-19 DIAGNOSIS — M6281 Muscle weakness (generalized): Secondary | ICD-10-CM | POA: Diagnosis not present

## 2021-06-19 DIAGNOSIS — M25632 Stiffness of left wrist, not elsewhere classified: Secondary | ICD-10-CM | POA: Diagnosis not present

## 2021-06-21 DIAGNOSIS — M25632 Stiffness of left wrist, not elsewhere classified: Secondary | ICD-10-CM | POA: Diagnosis not present

## 2021-06-21 DIAGNOSIS — M6281 Muscle weakness (generalized): Secondary | ICD-10-CM | POA: Diagnosis not present

## 2021-06-21 DIAGNOSIS — M25532 Pain in left wrist: Secondary | ICD-10-CM | POA: Diagnosis not present

## 2021-06-22 ENCOUNTER — Other Ambulatory Visit: Payer: Self-pay | Admitting: Cardiology

## 2021-06-24 ENCOUNTER — Ambulatory Visit: Payer: BC Managed Care – PPO | Admitting: Sports Medicine

## 2021-06-24 ENCOUNTER — Ambulatory Visit (INDEPENDENT_AMBULATORY_CARE_PROVIDER_SITE_OTHER): Payer: BC Managed Care – PPO

## 2021-06-24 ENCOUNTER — Other Ambulatory Visit: Payer: Self-pay

## 2021-06-24 DIAGNOSIS — S63592A Other specified sprain of left wrist, initial encounter: Secondary | ICD-10-CM | POA: Diagnosis not present

## 2021-06-24 DIAGNOSIS — M65832 Other synovitis and tenosynovitis, left forearm: Secondary | ICD-10-CM | POA: Diagnosis not present

## 2021-06-24 DIAGNOSIS — G8929 Other chronic pain: Secondary | ICD-10-CM

## 2021-06-24 DIAGNOSIS — M25532 Pain in left wrist: Secondary | ICD-10-CM

## 2021-06-24 DIAGNOSIS — S56512A Strain of other extensor muscle, fascia and tendon at forearm level, left arm, initial encounter: Secondary | ICD-10-CM | POA: Diagnosis not present

## 2021-06-24 HISTORY — DX: Other chronic pain: G89.29

## 2021-06-24 IMAGING — MR MR WRIST*L* W/CM
5 of 7 series · 26 of 40 positions shown · IV contrast (agent unspecified)
Comparison: X-ray wrist [DATE].

CLINICAL DATA: Left ulnar wrist pain radiating into forearm for 4
months. Clinical concern for triangular fibrocartilage complex.

EXAM:
MRI OF THE LEFT WRIST WITH CONTRAST (MR Arthrogram)
TECHNIQUE: Multiplanar, multisequence MR imaging of the wrist was performed
immediately following contrast injection into the radiocarpal joint
under fluoroscopic guidance. No intravenous contrast was
administered.

[Series 5: T1 fat-sat · axial · 3.0mm · 0.20mm/px · z∈[-42,-28]mm · 2 of 24 slices shown]
[im 1/24]
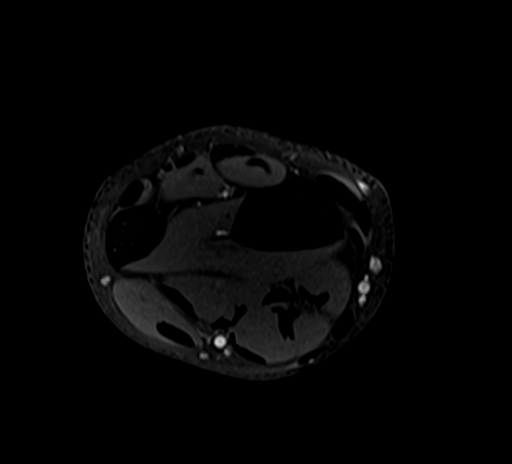
[im 5/24]
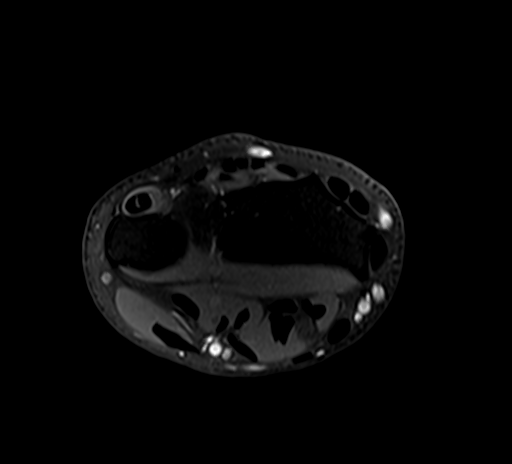

[Series 6: T2 fat-sat · axial · 3.0mm · 0.39mm/px · z∈[-42,+39]mm · 6 of 24 slices shown (1 of 4)]
[im 1/24]
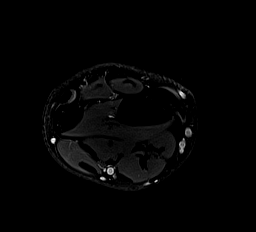
[im 5/24]
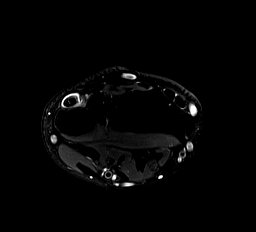
[im 10/24]
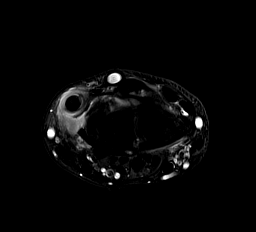
[im 14/24]
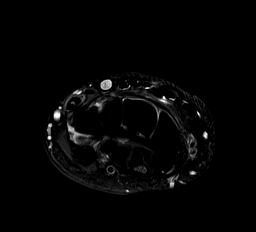
[im 19/24]
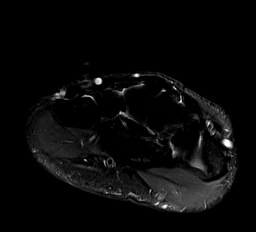
[im 24/24]
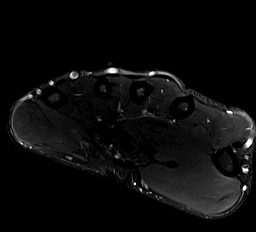

[Series 9: T2 fat-sat · coronal · 3.0mm · 0.39mm/px · 4 of 15 slices shown (2 of 4)]
[im 1/15]
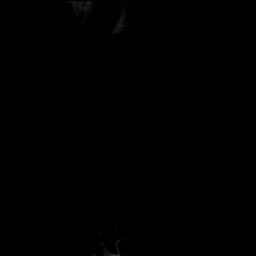
[im 5/15]
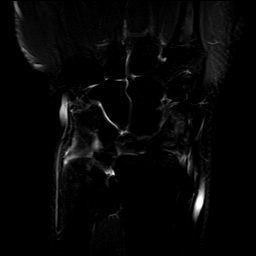
[im 10/15]
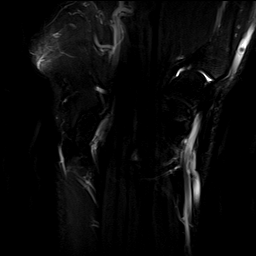
[im 15/15]
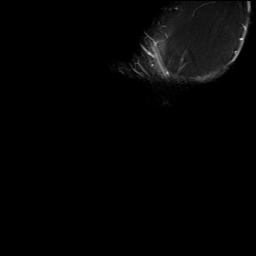

[Series 10: T2 fat-sat · sagittal · 3.0mm · 0.20mm/px · 7 of 23 slices shown (3 of 4)]
[im 1/23]
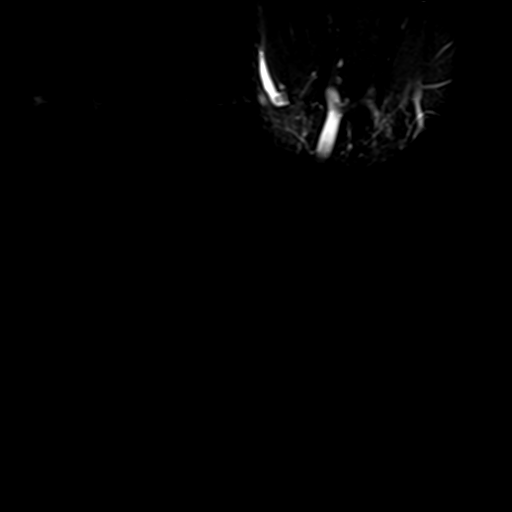
[im 4/23]
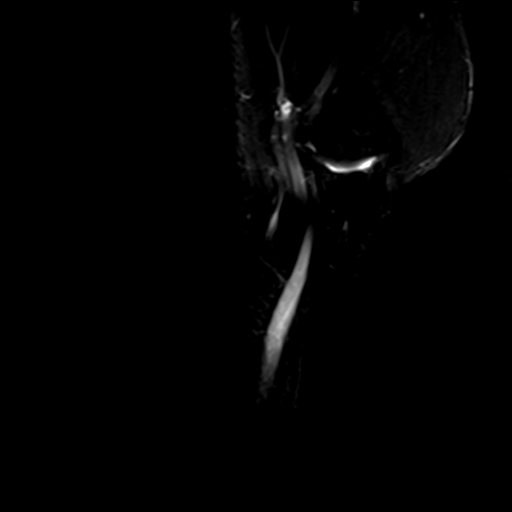
[im 8/23]
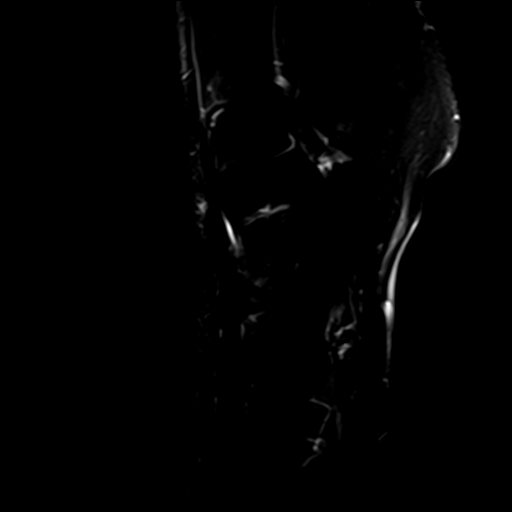
[im 12/23]
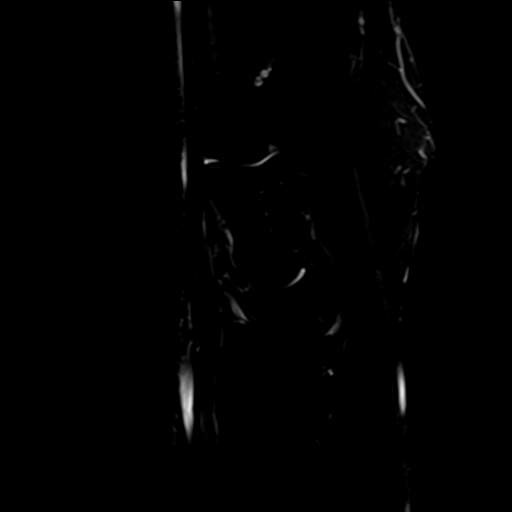
[im 15/23]
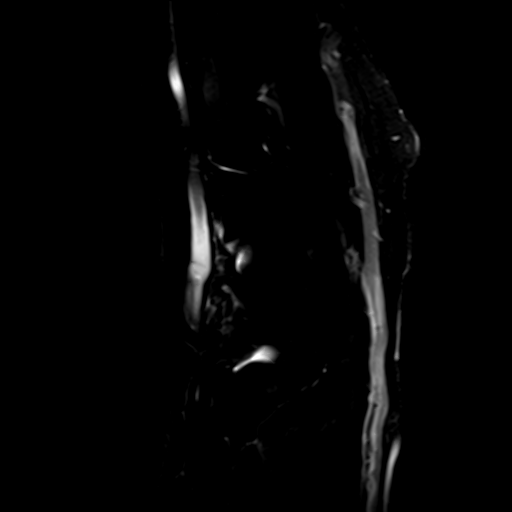
[im 19/23]
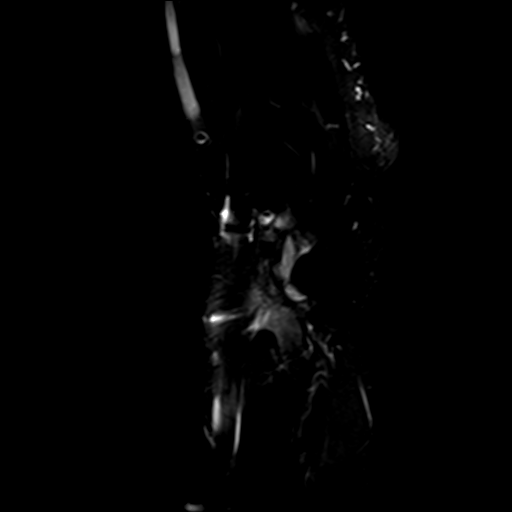
[im 23/23]
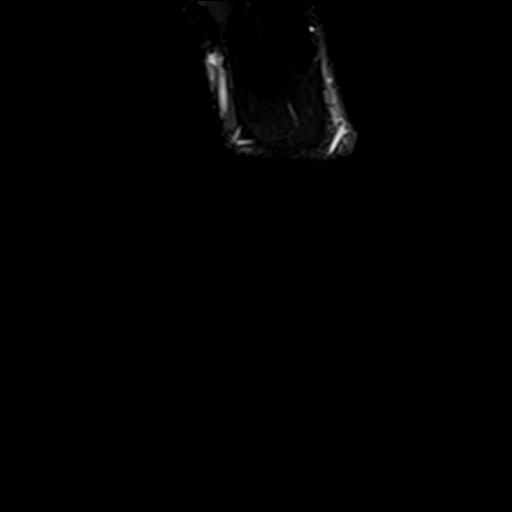

[Series 11: T2 fat-sat · sagittal · 3.0mm · 0.20mm/px · 7 of 23 slices shown (4 of 4)]
[im 1/23]
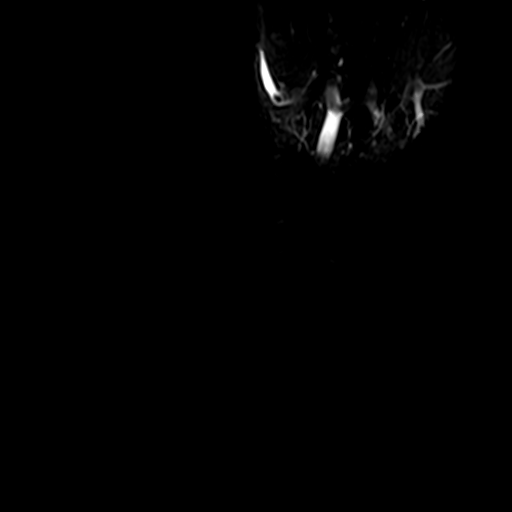
[im 4/23]
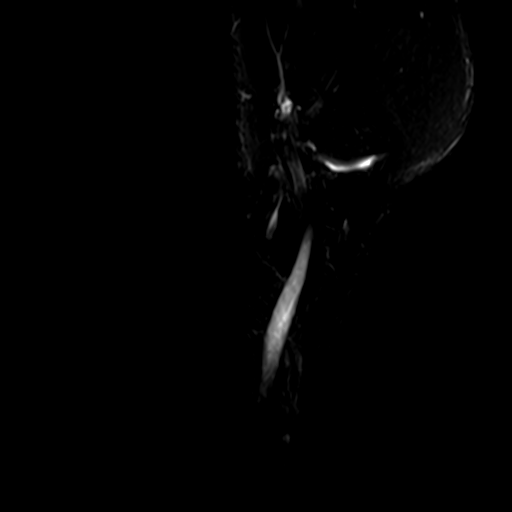
[im 8/23]
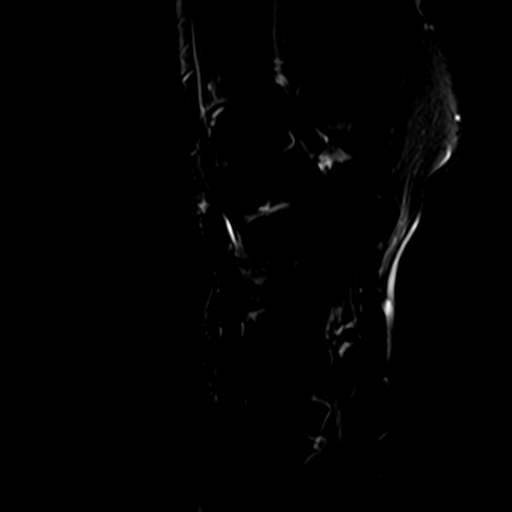
[im 12/23]
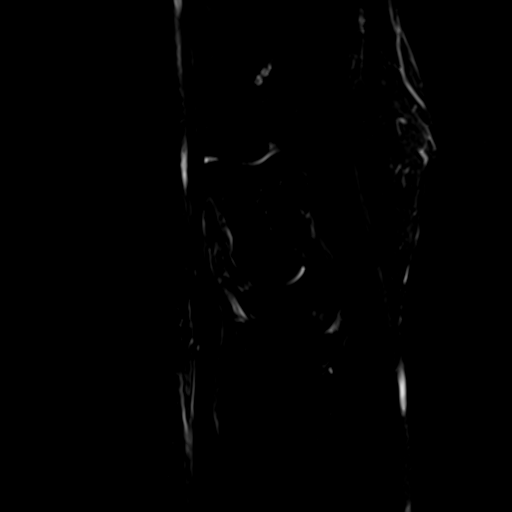
[im 15/23]
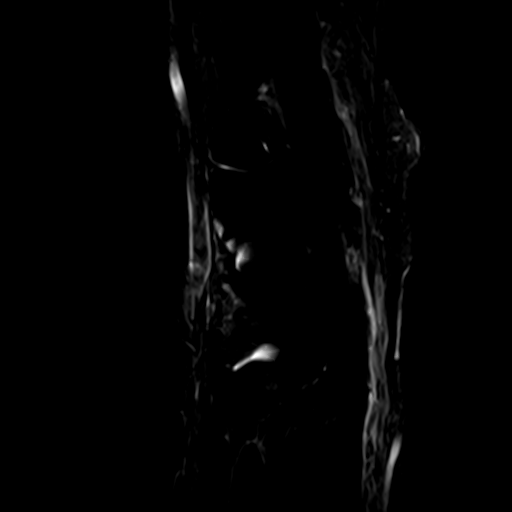
[im 19/23]
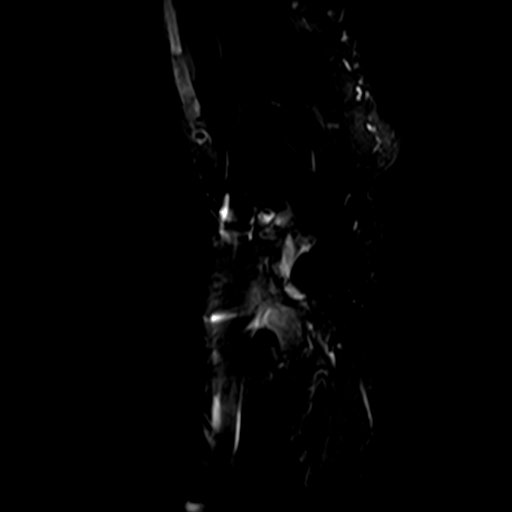
[im 23/23]
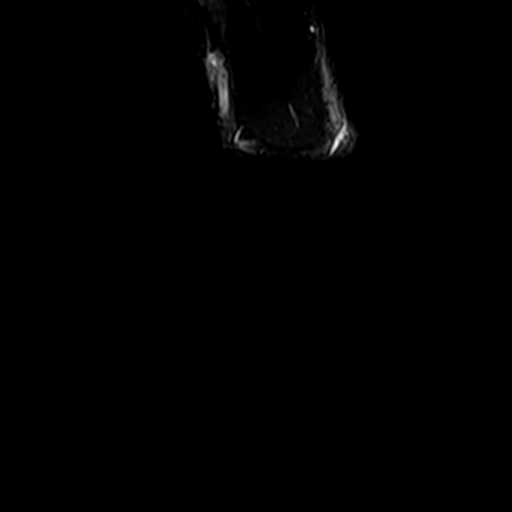

[26 of 40 positions shown; findings below may reference images not displayed]

FINDINGS: Ligaments: Scapholunate and lunotriquetral ligaments are intact.

Triangular fibrocartilage: There is trace contrast material within
the distal radioulnar joint suggestive of a small perforation.

Tendons: Tendinosis of the extensor carpi ulnaris tendon with
tenosynovitis and multifocal split tearing noted at the ulnar groove
and distally at the level of the distal carpal row (axial T2 image
19 and 11). Adjacent soft tissue edema. There is no other
significant tenosynovitis. The other extensor tendons are
unremarkable. Flexor tendons are unremarkable.

Carpal tunnel/median nerve: Flexor retinaculum is intact. Normal
carpal tunnel without a mass. Median nerve demonstrates normal
signal and caliber.

Guyon's canal: Normal Guyon's canal. Normal ulnar nerve.

Joint/cartilage: No chondral defect.

Bones/carpal alignment: There is thin bony edema in the radial
styloid. No discrete fracture line. This likely reactive. No focal
bone lesion.

Other: Muscles are normal. No fluid collection, hematoma, or soft
tissue mass.
IMPRESSION: Tendinosis of the extensor carpi ulnaris tendon with tenosynovitis
and small focal split tears noted at the level of the ulnar groove
and the distal carpal row.

Small amount of contrast material within the distal radioulnar
joint, suggestive of a small TFCC perforation. No large TFCC tear.

Intact scapholunate and lunotriquetral ligaments.

## 2021-06-24 MED ORDER — GADOBUTROL 1 MMOL/ML IV SOLN
1.0000 mL | Freq: Once | INTRAVENOUS | Status: AC | PRN
  Administered 2021-06-24: 1 mL via INTRAVENOUS

## 2021-06-24 NOTE — Progress Notes (Signed)
° ° °  Procedures performed today:    Procedure: Real-time Ultrasound Guided gadolinium contrast injection of left radiocarpal joint Device: Samsung [id number]  Verbal informed consent obtained.  Time-out conducted.  Noted no overlying erythema, induration, or other signs of local infection.  Skin prepped in a sterile fashion.  Local anesthesia: Topical Ethyl chloride.  With sterile technique and under real time ultrasound guidance: 25-gauge needle advanced into the radiocarpal joint, I injected 1/2 cc lidocaine, 1/2 cc kenalog 40, syringe switched and 0.05 cc gadolinium injected, syringe again switched and 1 cc saline used to fully distend the joint. Joint visualized and capsule seen distending confirming intra-articular placement of contrast material and medication. Completed without difficulty  Advised to call if fevers/chills, erythema, induration, drainage, or persistent bleeding.  Images permanently stored in PACS Impression: Technically successful ultrasound guided gadolinium contrast injection for MR arthrography.  Please see separate MR arthrogram report.  Independent interpretation of notes and tests performed by another provider:   None.  Brief History, Exam, Impression, and Recommendations:    Chronic wrist pain, left Chronic left wrist pain, injection performed today for MR arthrography, further management per primary treating provider.    ___________________________________________ Ihor Austin. Benjamin Stain, M.D., ABFM., CAQSM. Primary Care and Sports Medicine [facility]  Adjunct Instructor of Family Medicine  [employer]

## 2021-06-24 NOTE — Assessment & Plan Note (Signed)
Chronic left wrist pain, injection performed today for MR arthrography, further management per primary treating provider. 

## 2021-06-25 NOTE — Progress Notes (Signed)
Wrist MRI arthrogram just show some tendinitis and a split tear of the extensor carpi ulnaris tendon and a possible small tear of the TFCC. Typically this is something that should be able to heal conservatively.  However you have been trying that.  Would you like a referral to hand surgery for a consultation and to discuss options going forward?

## 2021-06-26 ENCOUNTER — Other Ambulatory Visit: Payer: Self-pay

## 2021-06-26 ENCOUNTER — Ambulatory Visit (INDEPENDENT_AMBULATORY_CARE_PROVIDER_SITE_OTHER): Payer: BC Managed Care – PPO | Admitting: Family Medicine

## 2021-06-26 DIAGNOSIS — M25532 Pain in left wrist: Secondary | ICD-10-CM | POA: Diagnosis not present

## 2021-06-26 NOTE — Progress Notes (Signed)
Virtual Visit  via phone Note   I connected with Jeffery Boyd  today by a video enabled telemedicine application and verified that I am speaking with the correct person using two identifiers.  ? Location of the provider office ? Location of the patient home ? The names and roles of all persons participating in the visit. Patient and myself   I discussed the limitations, risks, security and privacy concerns of performing an evaluation and management service by telephone and the availability of in person appointments. I also discussed with the patient that there may be a patient responsible charge related to this service. The patient expressed understanding and agreed to proceed.    I discussed the limitations of evaluation and management by telemedicine and the availability of in person appointments. The patient expressed understanding and agreed to proceed.  History of Present Illness: Jeffery Boyd is a 36 y.o. male who would like to discuss his L wrist pain and discuss his MRI results.  I last saw the pt on 06/05/21 at which point he noted 70-75% improvement w/ PT/OT but due to plateau of improvement after additional hand therapy, the pt was referred for a L wrist MRI that indicates tendinosis and tenosynovitis of the ECU tendon w/ small focal split tears at the ulnar groove.  Today, pt reports    Lab and Radiology Results No results found for this or any previous visit (from the past 72 hour(s)). MR WRIST LEFT W CONTRAST  Result Date: 06/24/2021 CLINICAL DATA:  Left ulnar wrist pain radiating into forearm for 4 months. Clinical concern for triangular fibrocartilage complex. EXAM: MRI OF THE LEFT WRIST WITH CONTRAST (MR Arthrogram) TECHNIQUE: Multiplanar, multisequence MR imaging of the wrist was performed immediately following contrast injection into the radiocarpal joint under fluoroscopic guidance. No intravenous contrast was administered. COMPARISON:  X-ray wrist 04/22/2021. FINDINGS:  Ligaments: Scapholunate and lunotriquetral ligaments are intact. Triangular fibrocartilage: There is trace contrast material within the distal radioulnar joint suggestive of a small perforation. Tendons: Tendinosis of the extensor carpi ulnaris tendon with tenosynovitis and multifocal split tearing noted at the ulnar groove and distally at the level of the distal carpal row (axial T2 image 19 and 11). Adjacent soft tissue edema. There is no other significant tenosynovitis. The other extensor tendons are unremarkable. Flexor tendons are unremarkable. Carpal tunnel/median nerve: Flexor retinaculum is intact. Normal carpal tunnel without a mass. Median nerve demonstrates normal signal and caliber. Guyon's canal: Normal Guyon's canal. Normal ulnar nerve. Joint/cartilage: No chondral defect. Bones/carpal alignment: There is thin bony edema in the radial styloid. No discrete fracture line. This likely reactive. No focal bone lesion. Other: Muscles are normal. No fluid collection, hematoma, or soft tissue mass. IMPRESSION: Tendinosis of the extensor carpi ulnaris tendon with tenosynovitis and small focal split tears noted at the level of the ulnar groove and the distal carpal row. Small amount of contrast material within the distal radioulnar joint, suggestive of a small TFCC perforation. No large TFCC tear. Intact scapholunate and lunotriquetral ligaments. Electronically Signed   By: Caprice Renshaw M.D.   On: 06/24/2021 16:37   [place] LIMITED JOINT SPACE STRUCTURES UP LEFT  Result Date: 06/24/2021 Procedure: Real-time Ultrasound Guided gadolinium contrast injection of left radiocarpal joint Device: Samsung HS60 Verbal informed consent obtained. Time-out conducted. Noted no overlying erythema, induration, or other signs of local infection. Skin prepped in a sterile fashion. Local anesthesia: Topical Ethyl chloride. With sterile technique and under real time ultrasound guidance: 25-gauge needle advanced  into the radiocarpal  joint, I injected 1/2 cc lidocaine, 1/2 cc kenalog 40, syringe switched and 0.05 cc gadolinium injected, syringe again switched and 1 cc saline used to fully distend the joint. Joint visualized and capsule seen distending confirming intra-articular placement of contrast material and medication. Completed without difficulty Advised to call if fevers/chills, erythema, induration, drainage, or persistent bleeding. Images permanently stored in PACS Impression: Technically successful ultrasound guided gadolinium contrast injection for MR arthrography.  Please see separate MR arthrogram report.    Assessment and Plan: 36 y.o. male with left wrist pain and dysfunction. Significant lack of wrist extension associated with dorsal and ulnar wrist pain. He has tried and is current tried hand PT/OT with marginal benefit.  He had MRI arthrogram recently including a steroid injection.  We discussed the results of the MRI arthrogram which partially explain his symptoms.  He thinks the steroid component is helping a little bit.  I think at this point is worthwhile having a second opinion/surgical opinion with hand surgery.  Will refer to Dr. Sharilyn Sites Ortho care.  Discussed that the outcome of this visit may be surgery or it may be more conservative management options.  Recheck based on outcome of surgical consultation/second opinion. PDMP not reviewed this encounter. Orders Placed This Encounter  Procedures   Ambulatory referral to Orthopedic Surgery    Referral Priority:   Routine    Referral Type:   Surgical    Referral Reason:   Specialty Services Required    Referred to Provider:   Marlyne Beards, MD    Requested Specialty:   Orthopedic Surgery    Number of Visits Requested:   1   No orders of the defined types were placed in this encounter.   Follow Up Instructions:    I discussed the assessment and treatment plan with the patient. The patient was provided an opportunity to ask questions and all  were answered. The patient agreed with the plan and demonstrated an understanding of the instructions.   The patient was advised to call back or seek an in-person evaluation if the symptoms worsen or if the condition fails to improve as anticipated.  Time: 15-minute phone conversation.

## 2021-07-02 ENCOUNTER — Other Ambulatory Visit: Payer: Self-pay

## 2021-07-02 ENCOUNTER — Ambulatory Visit: Payer: BC Managed Care – PPO | Admitting: Orthopedic Surgery

## 2021-07-02 ENCOUNTER — Encounter: Payer: Self-pay | Admitting: Orthopedic Surgery

## 2021-07-02 DIAGNOSIS — M778 Other enthesopathies, not elsewhere classified: Secondary | ICD-10-CM | POA: Insufficient documentation

## 2021-07-02 HISTORY — DX: Other enthesopathies, not elsewhere classified: M77.8

## 2021-07-02 NOTE — Progress Notes (Signed)
Office Visit Note   Patient: Jeffery Boyd           Date of Birth: 04-Nov-1985           MRN: [id number] Visit Date: 07/02/2021              Requested by: Rodolph Bong, MD [address] [place],  [place] [address] PCP: Sharlene Dory, DO   Assessment & Plan: Visit Diagnoses:  1. Extensor carpi ulnaris tendinitis     Plan: We discussed the diagnosis, prognosis, non-operative and operative treatment options for ECU tendonitis  He underwent corticosteroid injection of the left wrist at the time of the MRI and has since had complete resolution of symptoms.  He has 0 out of 10 pain today.  He says that his pain is completely resolved.  We discussed that his symptoms return he can follow-up again with me or with Dr. Denyse Amass.  We discussed that typically ECU tendinitis is treated nonsurgically unless there is evidence of ECU instability.  There is no evidence of instability on exam today.  All patient questions and concerns were addressed.    Follow-Up Instructions: No follow-ups on file.   Orders:  No orders of the defined types were placed in this encounter.  No orders of the defined types were placed in this encounter.     Procedures: No procedures performed   Clinical Data: No additional findings.   Subjective: Chief Complaint  Patient presents with   Left Wrist - Pain    No known injury, pain started in mid-forearm, then moved to lateral wrist and now alternates with top of wrist, he is on steroids, and it is helping, but still has some activities that bother it. Had a recent MRI done 06/24/2021, started in September 2022, RIGHT handed    This is a 36 year old right-hand-dominant male who presents with ulnar-sided left wrist pain.  He is previously been treated by Dr. [name].  Starting around September of last year he noticed pain at proximal and lateral aspect of the forearm.  This eventually migrated distally into the dorsal and ulnar aspect of the wrist.   He had pain with her activities involving hyperextension or rotation.  He has tried bracing and occupational therapy.  He notes that he actually thinks he was worse when doing therapy.  He had an MRI performed on 06/24/2021 and had a corticosteroid injection into the wrist at the same time.  He notes that today he has no pain.  His pain has completely resolved since the steroid injection.   Review of Systems   Objective: Vital Signs: BP 99/67 (BP Location: Left Arm, Patient Position: Sitting)    Pulse (!) 52    Ht 5\' 6"  (1.676 m)    Wt 174 lb (78.9 kg)    BMI 28.08 kg/m   Physical Exam Constitutional:      Appearance: Normal appearance.  Cardiovascular:     Rate and Rhythm: Normal rate.     Pulses: Normal pulses.  Pulmonary:     Effort: Pulmonary effort is normal.  Skin:    General: Skin is warm and dry.     Capillary Refill: Capillary refill takes less than 2 seconds.  Neurological:     Mental Status: He is alert.    Left Hand Exam   Tenderness  Left hand tenderness location: Minimal to no TTP at dorsal ulnar aspect of wrist.   Range of Motion  The patient has normal left wrist  ROM.  Other  Erythema: absent Sensation: normal Pulse: present  Comments:  Mild pain w/ ECU synergy test.  No evidence of ECU instability.      Specialty Comments:  No specialty comments available.  Imaging: Recent MRI of the left wrist reviewed interpreted by me today.  Demonstrates tendinitis around the ECU tendon with some split tears at the level of the DRUJ.  The TFCC appears to be intact.  There may be a central perforation but the foveal insertion is intact.  The SL and LT ligaments appear to be intact.   PMFS History: Patient Active Problem List   Diagnosis Date Noted   Extensor carpi ulnaris tendinitis 07/02/2021   Chronic wrist pain, left 06/24/2021   Pure hypercholesterolemia 12/14/2020   Contusion of bone 03/19/2020   Ventricular premature depolarization 05/26/2018    Hyperlipidemia 05/26/2018   Past Medical History:  Diagnosis Date   Hyperlipidemia 05/26/2018   Ventricular premature depolarization 05/26/2018   On Atenolol    Family History  Problem Relation Age of Onset   Hypertension Father    Hyperlipidemia Father    Heart attack Paternal Uncle 107   Asthma Mother     Past Surgical History:  Procedure Laterality Date   WISDOM TOOTH EXTRACTION     Social History   Occupational History   Not on file  Tobacco Use   Smoking status: Never    Passive exposure: Never   Smokeless tobacco: Never  Vaping Use   Vaping Use: Never used  Substance and Sexual Activity   Alcohol use: Yes    Comment: occ   Drug use: Not Currently   Sexual activity: Not on file

## 2021-07-03 ENCOUNTER — Encounter: Payer: Self-pay | Admitting: Family Medicine

## 2021-07-26 ENCOUNTER — Other Ambulatory Visit: Payer: BC Managed Care – PPO

## 2021-08-02 ENCOUNTER — Other Ambulatory Visit (INDEPENDENT_AMBULATORY_CARE_PROVIDER_SITE_OTHER): Payer: BC Managed Care – PPO

## 2021-08-02 DIAGNOSIS — E7800 Pure hypercholesterolemia, unspecified: Secondary | ICD-10-CM | POA: Diagnosis not present

## 2021-08-02 LAB — LIPID PANEL
Cholesterol: 248 mg/dL — ABNORMAL HIGH (ref 0–200)
HDL: 45.9 mg/dL (ref >39.00)
LDL Cholesterol: 180 mg/dL — ABNORMAL HIGH (ref 0–99)
NonHDL: 201.77
Total CHOL/HDL Ratio: 5
Triglycerides: 109 mg/dL (ref 0.0–149.0)
VLDL: 21.8 mg/dL (ref 0.0–40.0)

## 2021-08-25 ENCOUNTER — Other Ambulatory Visit: Payer: Self-pay | Admitting: Cardiology

## 2021-11-19 ENCOUNTER — Encounter: Payer: Self-pay | Admitting: Family Medicine

## 2021-11-27 ENCOUNTER — Ambulatory Visit: Payer: Self-pay

## 2021-11-27 ENCOUNTER — Ambulatory Visit: Payer: BC Managed Care – PPO | Admitting: Family Medicine

## 2021-11-27 VITALS — BP 152/88 | HR 53 | Ht 66.0 in | Wt 177.8 lb

## 2021-11-27 DIAGNOSIS — M25532 Pain in left wrist: Secondary | ICD-10-CM | POA: Diagnosis not present

## 2021-11-27 NOTE — Patient Instructions (Signed)
Thank you for coming in today.   Continue wrist brace.   Try a thumb loop or a wrist widget brace.

## 2021-11-27 NOTE — Progress Notes (Signed)
I, [name], LAT, ATC acting as a scribe for [name], MD.  Jeffery Boyd is a 36 y.o. male who presents to Jeffery Boyd today for f/u L wrist pain due to indicates tendinosis and tenosynovitis of the ECU tendon w/ small focal split tears at the ulnar groove as seen on MRI arthrogram. Pt had a virtual visit w/ Dr. [name] on 06/26/21 and was referred to Dr. [name] for a second opinion. Pt had a visit w/ Dr. [name] on 07/02/21 and was not having any pain due to injection from arthrogram. Pt sent a MyChart message on 11/19/21 reporting prior steroid injection wore off after about 8 weeks and was advised we could try injecting the ECU tendon. Today, pt reports pain in his L wrist started to return mid-March and continued to worsen since. Pt is currently having to wear his wrist brace.  Dx imaging: 06/24/21 L wrist MRI  04/22/21 L wrist XR  Pertinent review of systems: No fevers or chills  Relevant historical information: Hypercholesterolemia.   Exam:  BP (!) 152/88   Pulse (!) 53   Ht 5\' 6"  (1.676 m)   Wt 177 lb 12.8 oz (80.6 kg)   SpO2 97%   BMI 28.70 kg/m  General: Well Developed, well nourished, and in no acute distress.   MSK: Left wrist: Normal. Tender palpation over the ECU tendon. Pain with resisted wrist extension.    Lab and Radiology Results  Procedure: Real-time Ultrasound Guided Injection of left ECU tendon sheath Device: Philips Affiniti 50G Images permanently stored and available for review in PACS Verbal informed consent obtained.  Discussed risks and benefits of procedure. Warned about infection, bleeding, hyperglycemia damage to structures among others. Patient expresses understanding and agreement Time-out conducted.   Noted no overlying erythema, induration, or other signs of local infection.   Skin prepped in a sterile fashion.   Local anesthesia: Topical Ethyl chloride.   With sterile technique and under real time  ultrasound guidance: 40 mg of Kenalog and 1 mL of lidocaine injected into left ECU tendon sheath. Fluid seen entering the tendon sheath.   Completed without difficulty   Pain immediately resolved suggesting accurate placement of the medication.   Advised to call if fevers/chills, erythema, induration, drainage, or persistent bleeding.   Images permanently stored and available for review in the ultrasound unit.  Impression: Technically successful ultrasound guided injection.    EXAM: MRI OF THE LEFT WRIST WITH CONTRAST (MR Arthrogram)   TECHNIQUE: Multiplanar, multisequence MR imaging of the wrist was performed immediately following contrast injection into the radiocarpal joint under fluoroscopic guidance. No intravenous contrast was administered.   COMPARISON:  X-ray wrist 04/22/2021.   FINDINGS: Ligaments: Scapholunate and lunotriquetral ligaments are intact.   Triangular fibrocartilage: There is trace contrast material within the distal radioulnar joint suggestive of a small perforation.   Tendons: Tendinosis of the extensor carpi ulnaris tendon with tenosynovitis and multifocal split tearing noted at the ulnar groove and distally at the level of the distal carpal row (axial T2 image 19 and 11). Adjacent soft tissue edema. There is no other significant tenosynovitis. The other extensor tendons are unremarkable. Flexor tendons are unremarkable.   Carpal tunnel/median nerve: Flexor retinaculum is intact. Normal carpal tunnel without a mass. Median nerve demonstrates normal signal and caliber.   Guyon's canal: Normal Guyon's canal. Normal ulnar nerve.   Joint/cartilage: No chondral defect.   Bones/carpal alignment: There is thin bony edema in the radial styloid. No  discrete fracture line. This likely reactive. No focal bone lesion.   Other: Muscles are normal. No fluid collection, hematoma, or soft tissue mass.   IMPRESSION: Tendinosis of the extensor carpi ulnaris  tendon with tenosynovitis and small focal split tears noted at the level of the ulnar groove and the distal carpal row.   Small amount of contrast material within the distal radioulnar joint, suggestive of a small TFCC perforation. No large TFCC tear.   Intact scapholunate and lunotriquetral ligaments.     Electronically Signed   By: [name] M.D.   On: 06/24/2021 16:37 I, [name], personally (independently) visualized and performed the interpretation of the images attached in this note.     Assessment and Plan: 36 y.o. male with left ECU tenosynovitis.  Plan for injection today.  Recommend wrist bracing.  Recheck back as needed.  If not improving consider recheck back with hand surgeon.   PDMP not reviewed this encounter. Orders Placed This Encounter  Procedures   [place] LIMITED JOINT SPACE STRUCTURES UP LEFT(NO LINKED CHARGES)    Order Specific Question:   Reason for Exam (SYMPTOM  OR DIAGNOSIS REQUIRED)    Answer:   left wrist pain    Order Specific Question:   Preferred imaging location?    Answer:   [facility]   No orders of the defined types were placed in this encounter.    Discussed warning signs or symptoms. Please see discharge instructions. Patient expresses understanding.   .The above documentation has been reviewed and is accurate and complete [name], M.D.

## 2022-05-27 ENCOUNTER — Other Ambulatory Visit: Payer: Self-pay | Admitting: Cardiology

## 2022-06-18 ENCOUNTER — Encounter: Payer: BC Managed Care – PPO | Admitting: Family Medicine

## 2022-06-20 ENCOUNTER — Ambulatory Visit (INDEPENDENT_AMBULATORY_CARE_PROVIDER_SITE_OTHER): Payer: BC Managed Care – PPO | Admitting: Family Medicine

## 2022-06-20 VITALS — BP 112/66 | HR 50 | Temp 98.2°F | Resp 18 | Ht 66.0 in | Wt 174.0 lb

## 2022-06-20 DIAGNOSIS — Z23 Encounter for immunization: Secondary | ICD-10-CM | POA: Diagnosis not present

## 2022-06-20 DIAGNOSIS — Z0000 Encounter for general adult medical examination without abnormal findings: Secondary | ICD-10-CM | POA: Diagnosis not present

## 2022-06-20 NOTE — Progress Notes (Signed)
Chief Complaint  Patient presents with   Annual Exam    [name] is here for a complete physical.   His last physical was >1 year ago.  Current diet: in general, a "healthy" diet.   Current exercise: not much Weight trend: stable Fatigue out of ordinary? No. Seat belt? Yes.   Advanced directive? No  Health maintenance Tetanus- Due HIV- Yes Hep C- Yes  Past Medical History:  Diagnosis Date   Hyperlipidemia 05/26/2018   Ventricular premature depolarization 05/26/2018   On Atenolol     Past Surgical History:  Procedure Laterality Date   WISDOM TOOTH EXTRACTION      Medications  Current Outpatient Medications on File Prior to Visit  Medication Sig Dispense Refill   atenolol (TENORMIN) 25 MG tablet Take 1 tablet (25 mg total) by mouth daily. Patient needs appointment for further refills. 1 st attempt 30 tablet 0   valACYclovir (VALTREX) 1000 MG tablet Take 2 tabs and repeat in 12 hrs for cold sores. 30 tablet 1   No current facility-administered medications on file prior to visit.     Allergies No Known Allergies  Family History Family History  Problem Relation Age of Onset   Hypertension Father    Hyperlipidemia Father    Heart attack Paternal Uncle 82   Asthma Mother     Review of Systems: Constitutional: no fevers or chills Eye:  no recent significant change in vision Ear/Nose/Mouth/Throat:  Ears:  no hearing loss Nose/Mouth/Throat:  no complaints of nasal congestion, no sore throat Cardiovascular:  no chest pain Respiratory:  no shortness of breath Gastrointestinal:  no abdominal pain, no change in bowel habits GU:  Male: negative for dysuria Musculoskeletal/Extremities:  no pain of the joints Integumentary (Skin/Breast):  no abnormal skin lesions reported Neurologic:  no headaches Endocrine: No unexpected weight changes Hematologic/Lymphatic:  no night sweats  Exam BP 112/66   Pulse (!) 50   Temp 98.2 F (36.8 C)   Resp 18   Ht  5\' 6"  (1.676 m)   Wt 174 lb (78.9 kg)   SpO2 100%   BMI 28.08 kg/m  General:  well developed, well nourished, in no apparent distress Skin:  no significant moles, warts, or growths Head:  no masses, lesions, or tenderness Eyes:  pupils equal and round, sclera anicteric without injection Ears:  canals without lesions, TMs shiny without retraction, no obvious effusion, no erythema Nose:  nares patent, mucosa normal Throat/Pharynx:  lips and gingiva without lesion; tongue and uvula midline; non-inflamed pharynx; no exudates or postnasal drainage Neck: neck supple without adenopathy, thyromegaly, or masses Lungs:  clear to auscultation, breath sounds equal bilaterally, no respiratory distress Cardio:  regular rate and rhythm, no bruits, no LE edema Abdomen:  abdomen soft, nontender; bowel sounds normal; no masses or organomegaly Genital (male): Deferred Rectal: Deferred Musculoskeletal:  symmetrical muscle groups noted without atrophy or deformity Extremities:  no clubbing, cyanosis, or edema, no deformities, no skin discoloration Neuro:  gait normal; deep tendon reflexes normal and symmetric Psych: well oriented with normal range of affect and appropriate judgment/insight  Assessment and Plan  Well adult exam - Plan: CBC, Comprehensive metabolic panel, Lipid panel   Well 37 y.o. male. Counseled on diet and exercise. Self testicular exams recommended at least monthly.  He will probably have to start a statin. Will see what labs show.  Tdap today. Flu shot politely declined.  Advanced directive form provided today.  Other orders as above. Follow up in  1 year pending the above workup. The patient voiced understanding and agreement to the plan.  [place], DO 06/20/22 3:14 PM

## 2022-06-20 NOTE — Patient Instructions (Signed)
Give us 2-3 business days to get the results of your labs back.   Keep the diet clean and stay active.  Do monthly self testicular checks in the shower. You are feeling for lumps/bumps that don't belong. If you feel anything like this, let me know!  Please get me a copy of your advanced directive form at your convenience.   Let us know if you need anything.  

## 2022-06-20 NOTE — Addendum Note (Signed)
Addended by: Sharon Seller B on: 06/20/2022 03:48 PM   Modules accepted: Orders

## 2022-06-25 ENCOUNTER — Other Ambulatory Visit (INDEPENDENT_AMBULATORY_CARE_PROVIDER_SITE_OTHER): Payer: BC Managed Care – PPO

## 2022-06-25 ENCOUNTER — Encounter: Payer: Self-pay | Admitting: Family Medicine

## 2022-06-25 DIAGNOSIS — Z0000 Encounter for general adult medical examination without abnormal findings: Secondary | ICD-10-CM | POA: Diagnosis not present

## 2022-06-25 LAB — LIPID PANEL
Cholesterol: 252 mg/dL — ABNORMAL HIGH (ref 0–200)
HDL: 46.6 mg/dL (ref >39.00)
LDL Cholesterol: 181 mg/dL — ABNORMAL HIGH (ref 0–99)
NonHDL: 205.41
Total CHOL/HDL Ratio: 5
Triglycerides: 120 mg/dL (ref 0.0–149.0)
VLDL: 24 mg/dL (ref 0.0–40.0)

## 2022-06-25 LAB — CBC
HCT: 45.4 % (ref 39.0–52.0)
Hemoglobin: 15.4 g/dL (ref 13.0–17.0)
MCHC: 33.9 g/dL (ref 30.0–36.0)
MCV: 87.7 fl (ref 78.0–100.0)
Platelets: 264 10*3/uL (ref 150.0–400.0)
RBC: 5.18 Mil/uL (ref 4.22–5.81)
RDW: 13.3 % (ref 11.5–15.5)
WBC: 6.2 10*3/uL (ref 4.0–10.5)

## 2022-06-25 LAB — COMPREHENSIVE METABOLIC PANEL
ALT: 16 U/L (ref 0–53)
AST: 21 U/L (ref 0–37)
Albumin: 4.5 g/dL (ref 3.5–5.2)
Alkaline Phosphatase: 48 U/L (ref 39–117)
BUN: 18 mg/dL (ref 6–23)
CO2: 31 mEq/L (ref 19–32)
Calcium: 9.7 mg/dL (ref 8.4–10.5)
Chloride: 103 mEq/L (ref 96–112)
Creatinine, Ser: 1.39 mg/dL (ref 0.40–1.50)
GFR: 65.23 mL/min (ref >60.00)
Glucose, Bld: 74 mg/dL (ref 70–99)
Potassium: 4.2 mEq/L (ref 3.5–5.1)
Sodium: 141 mEq/L (ref 135–145)
Total Bilirubin: 0.7 mg/dL (ref 0.2–1.2)
Total Protein: 6.7 g/dL (ref 6.0–8.3)

## 2022-06-26 ENCOUNTER — Other Ambulatory Visit: Payer: Self-pay | Admitting: Family Medicine

## 2022-06-26 DIAGNOSIS — E7800 Pure hypercholesterolemia, unspecified: Secondary | ICD-10-CM

## 2022-06-26 MED ORDER — ROSUVASTATIN CALCIUM 20 MG PO TABS: 20.0000 mg | ORAL_TABLET | Freq: Every day | ORAL | 3 refills | Status: DC

## 2022-07-09 ENCOUNTER — Ambulatory Visit: Payer: BC Managed Care – PPO | Attending: Cardiology | Admitting: Cardiology

## 2022-07-09 ENCOUNTER — Encounter: Payer: Self-pay | Admitting: Cardiology

## 2022-07-09 VITALS — BP 102/58 | HR 55 | Ht 67.0 in | Wt 176.1 lb

## 2022-07-09 DIAGNOSIS — E7800 Pure hypercholesterolemia, unspecified: Secondary | ICD-10-CM | POA: Diagnosis not present

## 2022-07-09 DIAGNOSIS — I493 Ventricular premature depolarization: Secondary | ICD-10-CM | POA: Diagnosis not present

## 2022-07-09 MED ORDER — ATENOLOL 25 MG PO TABS: 25.0000 mg | ORAL_TABLET | Freq: Every day | ORAL | 3 refills | Status: DC

## 2022-07-09 NOTE — Progress Notes (Signed)
Cardiology Office Note:    Date:  07/09/2022   ID:  Jeffery Boyd, DOB [birth year], MRN [id number]  PCP:  Shelda Pal, DO  Cardiologist:  Jenean Lindau, MD   Referring MD: Shelda Pal*    ASSESSMENT:    1. Ventricular premature depolarization   2. Pure hypercholesterolemia    PLAN:    In order of problems listed above:  Primary prevention stressed with the patient.  Importance of compliance with diet medication stressed and he vocalized understanding.  He was advised to exercise at least 30 minutes a day 5 times a week he vocalized understanding and promises to do so. PVCs: Stable at this time.  Asymptomatic.  He is on beta-blocker and we will refill this for him today. Family hyperlipidemia: Markedly elevated lipids and his primary care has him on statin therapy.  They will follow him.  Diet emphasized.  He promises to follow up with religiously and follow with primary care and diet instructions. Patient will be seen in follow-up appointment in 12 months or earlier if the patient has any concerns    Medication Adjustments/Labs and Tests Ordered: Current medicines are reviewed at length with the patient today.  Concerns regarding medicines are outlined above.  Orders Placed This Encounter  Procedures   EKG 12-Lead   Meds ordered this encounter  Medications   atenolol (TENORMIN) 25 MG tablet    Sig: Take 1 tablet (25 mg total) by mouth daily.    Dispense:  90 tablet    Refill:  3     No chief complaint on file.    History of Present Illness:    Jeffery Boyd is a 37 y.o. male.  Patient has past medical history of hyperlipidemia and PVCs.  He is on beta-blocker and feels that it has helped him significantly.  No chest pain orthopnea or PND.  His lipids are markedly elevated.  It appears he has family hyperlipidemia.  He is now again began his exercise program.  At the time of my evaluation, the patient is alert awake oriented and in no  distress.  Past Medical History:  Diagnosis Date   Chronic wrist pain, left 06/24/2021   Contusion of bone 03/19/2020   Extensor carpi ulnaris tendinitis 07/02/2021   Hyperlipidemia 05/26/2018   Pure hypercholesterolemia 12/14/2020   Ventricular premature depolarization 05/26/2018   On Atenolol    Past Surgical History:  Procedure Laterality Date   WISDOM TOOTH EXTRACTION      Current Medications: Current Meds  Medication Sig   rosuvastatin (CRESTOR) 20 MG tablet Take 1 tablet (20 mg total) by mouth daily.   valACYclovir (VALTREX) 1000 MG tablet Take 2 tabs and repeat in 12 hrs for cold sores.   [DISCONTINUED] atenolol (TENORMIN) 25 MG tablet Take 1 tablet (25 mg total) by mouth daily. Patient needs appointment for further refills. 1 st attempt     Allergies:   Patient has no known allergies.   Social History   Socioeconomic History   Marital status: Married    Spouse name: Not on file   Number of children: Not on file   Years of education: Not on file   Highest education level: Not on file  Occupational History   Not on file  Tobacco Use   Smoking status: Never    Passive exposure: Never   Smokeless tobacco: Never  Vaping Use   Vaping Use: Never used  Substance and Sexual Activity   Alcohol use: Yes  Comment: occ   Drug use: Not Currently   Sexual activity: Not on file  Other Topics Concern   Not on file  Social History Narrative   Not on file   Social Determinants of Health   Financial Resource Strain: Not on file  Food Insecurity: Not on file  Transportation Needs: Not on file  Physical Activity: Not on file  Stress: Not on file  Social Connections: Not on file     Family History: The patient's family history includes Asthma in his mother; Heart attack (age of onset: 32) in his paternal uncle; Hyperlipidemia in his father; Hypertension in his father.  ROS:   Please see the history of present illness.    All other systems reviewed and are  negative.  EKGs/Labs/Other Studies Reviewed:    The following studies were reviewed today: EKG reveals sinus rhythm right axis deviation and nonspecific ST-T changes   Recent Labs: 06/25/2022: ALT 16; BUN 18; Creatinine, Ser 1.39; Hemoglobin 15.4; Platelets 264.0; Potassium 4.2; Sodium 141  Recent Lipid Panel    Component Value Date/Time   CHOL 252 (H) 06/25/2022 0809   TRIG 120.0 06/25/2022 0809   HDL 46.60 06/25/2022 0809   CHOLHDL 5 06/25/2022 0809   VLDL 24.0 06/25/2022 0809   LDLCALC 181 (H) 06/25/2022 0809   LDLCALC 133 (H) 12/14/2020 1510    Physical Exam:    VS:  BP (!) 102/58   Pulse (!) 55   Ht 5\' 7"  (1.702 m)   Wt 176 lb 1.3 oz (79.9 kg)   SpO2 97%   BMI 27.58 kg/m     Wt Readings from Last 3 Encounters:  07/09/22 176 lb 1.3 oz (79.9 kg)  06/20/22 174 lb (78.9 kg)  11/27/21 177 lb 12.8 oz (80.6 kg)     GEN: Patient is in no acute distress HEENT: Normal NECK: No JVD; No carotid bruits LYMPHATICS: No lymphadenopathy CARDIAC: Hear sounds regular, 2/6 systolic murmur at the apex. RESPIRATORY:  Clear to auscultation without rales, wheezing or rhonchi  ABDOMEN: Soft, non-tender, non-distended MUSCULOSKELETAL:  No edema; No deformity  SKIN: Warm and dry NEUROLOGIC:  Alert and oriented x 3 PSYCHIATRIC:  Normal affect   Signed, Jenean Lindau, MD  07/09/2022 4:58 PM    [facility]

## 2022-07-09 NOTE — Patient Instructions (Signed)
Medication Instructions:  Your physician recommends that you continue on your current medications as directed. Please refer to the Current Medication list given to you today.  *If you need a refill on your cardiac medications before your next appointment, please call your pharmacy*   Lab Work: None ordered If you have labs (blood work) drawn today and your tests are completely normal, you will receive your results only by: MyChart Message (if you have MyChart) OR A paper copy in the mail If you have any lab test that is abnormal or we need to change your treatment, we will call you to review the results.   Testing/Procedures: None ordered   Follow-Up: At [facility], you and your health needs are our priority.  As part of our continuing mission to provide you with exceptional heart care, we have created designated Provider Care Teams.  These Care Teams include your primary Cardiologist (physician) and Advanced Practice Providers (APPs -  Physician Assistants and Nurse Practitioners) who all work together to provide you with the care you need, when you need it.  We recommend signing up for the patient portal called "MyChart".  Sign up information is provided on this After Visit Summary.  MyChart is used to connect with patients for Virtual Visits (Telemedicine).  Patients are able to view lab/test results, encounter notes, upcoming appointments, etc.  Non-urgent messages can be sent to your provider as well.   To learn more about what you can do with MyChart, go to [email or web address].com.    Your next appointment:   12 month(s)  The format for your next appointment:   In Person  Provider:   Rajan Revankar, MD    Other Instructions none  Important Information About Sugar      

## 2022-07-18 ENCOUNTER — Ambulatory Visit: Payer: BC Managed Care – PPO | Admitting: Cardiology

## 2022-09-29 ENCOUNTER — Encounter: Payer: Self-pay | Admitting: *Deleted

## 2022-10-15 ENCOUNTER — Other Ambulatory Visit (INDEPENDENT_AMBULATORY_CARE_PROVIDER_SITE_OTHER): Payer: BC Managed Care – PPO

## 2022-10-15 DIAGNOSIS — E7800 Pure hypercholesterolemia, unspecified: Secondary | ICD-10-CM

## 2022-10-15 LAB — HEPATIC FUNCTION PANEL
ALT: 24 U/L (ref 0–53)
AST: 26 U/L (ref 0–37)
Albumin: 4.5 g/dL (ref 3.5–5.2)
Alkaline Phosphatase: 47 U/L (ref 39–117)
Bilirubin, Direct: 0.2 mg/dL (ref 0.0–0.3)
Total Bilirubin: 0.7 mg/dL (ref 0.2–1.2)
Total Protein: 6.7 g/dL (ref 6.0–8.3)

## 2022-10-15 LAB — LIPID PANEL
Cholesterol: 140 mg/dL (ref 0–200)
HDL: 44.5 mg/dL (ref >39.00)
LDL Cholesterol: 71 mg/dL (ref 0–99)
NonHDL: 95.31
Total CHOL/HDL Ratio: 3
Triglycerides: 124 mg/dL (ref 0.0–149.0)
VLDL: 24.8 mg/dL (ref 0.0–40.0)

## 2022-10-17 ENCOUNTER — Encounter: Payer: Self-pay | Admitting: Family Medicine

## 2022-10-17 MED ORDER — ROSUVASTATIN CALCIUM 20 MG PO TABS: 20.0000 mg | ORAL_TABLET | Freq: Every day | ORAL | 3 refills | Status: DC

## 2023-01-27 ENCOUNTER — Other Ambulatory Visit: Payer: Self-pay | Admitting: Family Medicine

## 2023-01-27 MED ORDER — ROSUVASTATIN CALCIUM 20 MG PO TABS: 20.0000 mg | ORAL_TABLET | Freq: Every day | ORAL | 3 refills | Status: DC

## 2023-03-08 ENCOUNTER — Encounter: Payer: Self-pay | Admitting: Family Medicine

## 2023-03-09 MED ORDER — VALACYCLOVIR HCL 1 G PO TABS: ORAL_TABLET | ORAL | 1 refills | Status: DC

## 2023-07-16 ENCOUNTER — Other Ambulatory Visit: Payer: Self-pay | Admitting: Cardiology

## 2023-07-16 NOTE — Telephone Encounter (Signed)
RX sent

## 2023-08-18 ENCOUNTER — Other Ambulatory Visit: Payer: Self-pay | Admitting: Cardiology

## 2023-08-19 NOTE — Telephone Encounter (Signed)
 Prescription sent to pharmacy.

## 2023-08-28 ENCOUNTER — Telehealth: Payer: Self-pay | Admitting: Cardiology

## 2023-08-28 MED ORDER — ATENOLOL 25 MG PO TABS: 25.0000 mg | ORAL_TABLET | Freq: Every day | ORAL | 0 refills | Status: DC

## 2023-08-28 NOTE — Telephone Encounter (Signed)
*  STAT* If patient is at the pharmacy, call can be transferred to refill team.   1. Which medications need to be refilled? (please list name of each medication and dose if known)  atenolol (TENORMIN) 25 MG tablet  2. Which pharmacy/location (including street and city if local pharmacy) is medication to be sent to? [id number] - [place], [place] - [address] AT [facility] [address] [address] Phone: [phone]  Fax: [phone]     3. Do they need a 30 day or 90 day supply? 90

## 2023-08-28 NOTE — Telephone Encounter (Signed)
 Pt's medication was sent to pt's pharmacy as requested. Confirmation received.

## 2023-08-30 ENCOUNTER — Other Ambulatory Visit: Payer: Self-pay | Admitting: Cardiology

## 2023-09-07 ENCOUNTER — Encounter: Payer: Self-pay | Admitting: Family Medicine

## 2023-09-07 ENCOUNTER — Ambulatory Visit (INDEPENDENT_AMBULATORY_CARE_PROVIDER_SITE_OTHER): Admitting: Family Medicine

## 2023-09-07 VITALS — BP 114/74 | HR 49 | Temp 98.8°F | Resp 16 | Ht 66.0 in | Wt 177.0 lb

## 2023-09-07 DIAGNOSIS — Z0000 Encounter for general adult medical examination without abnormal findings: Secondary | ICD-10-CM

## 2023-09-07 LAB — CBC
HCT: 46.4 % (ref 39.0–52.0)
Hemoglobin: 15.8 g/dL (ref 13.0–17.0)
MCHC: 34 g/dL (ref 30.0–36.0)
MCV: 88.7 fl (ref 78.0–100.0)
Platelets: 218 10*3/uL (ref 150.0–400.0)
RBC: 5.23 Mil/uL (ref 4.22–5.81)
RDW: 12.9 % (ref 11.5–15.5)
WBC: 5.4 10*3/uL (ref 4.0–10.5)

## 2023-09-07 LAB — LIPID PANEL
Cholesterol: 129 mg/dL (ref 0–200)
HDL: 44.9 mg/dL (ref >39.00)
LDL Cholesterol: 71 mg/dL (ref 0–99)
NonHDL: 84.21
Total CHOL/HDL Ratio: 3
Triglycerides: 68 mg/dL (ref 0.0–149.0)
VLDL: 13.6 mg/dL (ref 0.0–40.0)

## 2023-09-07 LAB — COMPREHENSIVE METABOLIC PANEL
ALT: 19 U/L (ref 0–53)
AST: 23 U/L (ref 0–37)
Albumin: 4.7 g/dL (ref 3.5–5.2)
Alkaline Phosphatase: 53 U/L (ref 39–117)
BUN: 18 mg/dL (ref 6–23)
CO2: 30 meq/L (ref 19–32)
Calcium: 9.9 mg/dL (ref 8.4–10.5)
Chloride: 103 meq/L (ref 96–112)
Creatinine, Ser: 1.43 mg/dL (ref 0.40–1.50)
GFR: 62.52 mL/min (ref >60.00)
Glucose, Bld: 83 mg/dL (ref 70–99)
Potassium: 4.3 meq/L (ref 3.5–5.1)
Sodium: 139 meq/L (ref 135–145)
Total Bilirubin: 0.8 mg/dL (ref 0.2–1.2)
Total Protein: 6.7 g/dL (ref 6.0–8.3)

## 2023-09-07 NOTE — Progress Notes (Signed)
 Chief Complaint  Patient presents with   Annual Exam    [name] is here for a complete physical.   His last physical was >1 year ago.  Current diet: in general, a "healthy" diet.   Current exercise: calisthenics, strength training Weight trend: stable Fatigue out of ordinary? No. Seat belt? Yes.   Advanced directive? Yes  Health maintenance Tetanus- Yes HIV- Yes Hep C- Yes  Past Medical History:  Diagnosis Date   Chronic wrist pain, left 06/24/2021   Contusion of bone 03/19/2020   Extensor carpi ulnaris tendinitis 07/02/2021   Hyperlipidemia 05/26/2018   Pure hypercholesterolemia 12/14/2020   Ventricular premature depolarization 05/26/2018   On Atenolol     Past Surgical History:  Procedure Laterality Date   WISDOM TOOTH EXTRACTION      Medications  Current Outpatient Medications on File Prior to Visit  Medication Sig Dispense Refill   atenolol (TENORMIN) 25 MG tablet Take 1 tablet (25 mg total) by mouth daily. 30 tablet 0   rosuvastatin (CRESTOR) 20 MG tablet Take 1 tablet (20 mg total) by mouth daily. 90 tablet 3   valACYclovir (VALTREX) 1000 MG tablet Take 2 tabs and repeat in 12 hrs for cold sores. 30 tablet 1   No current facility-administered medications on file prior to visit.     Allergies No Known Allergies  Family History Family History  Problem Relation Age of Onset   Hypertension Father    Hyperlipidemia Father    Heart attack Paternal Uncle 26   Asthma Mother     Review of Systems: Constitutional: no fevers or chills Eye:  no recent significant change in vision Ear/Nose/Mouth/Throat:  Ears:  no hearing loss Nose/Mouth/Throat:  no complaints of nasal congestion, no sore throat Cardiovascular:  no chest pain Respiratory:  no shortness of breath Gastrointestinal:  no abdominal pain, no change in bowel habits GU:  Male: negative for dysuria Musculoskeletal/Extremities:  no pain of the joints Integumentary (Skin/Breast):  no  abnormal skin lesions reported Neurologic:  no headaches Endocrine: No unexpected weight changes Hematologic/Lymphatic:  no night sweats  Exam BP 114/74 (BP Location: Left Arm, Patient Position: Sitting, Cuff Size: Normal)   Pulse (!) 49   Temp 98.8 F (37.1 C) (Oral)   Resp 16   Ht 5\' 6"  (1.676 m)   Wt 177 lb (80.3 kg)   SpO2 98%   BMI 28.57 kg/m  General:  well developed, well nourished, in no apparent distress Skin: 0.4 x 0.3 raised and fleshy lesion noted over the anterior right portion of the chest.  There is no erythema, scaling, TTP, or fluctuance.  There are speckled areas of hyperpigmentation.  Otherwise, no significant moles, warts, or growths Head:  no masses, lesions, or tenderness Eyes:  pupils equal and round, sclera anicteric without injection Ears:  canals without lesions, TMs shiny without retraction, no obvious effusion, no erythema Nose:  nares patent, mucosa normal Throat/Pharynx:  lips and gingiva without lesion; tongue and uvula midline; non-inflamed pharynx; no exudates or postnasal drainage Neck: neck supple without adenopathy, thyromegaly, or masses Lungs:  clear to auscultation, breath sounds equal bilaterally, no respiratory distress Cardio:  regular rate and rhythm, no bruits, no LE edema Abdomen:  abdomen soft, nontender; bowel sounds normal; no masses or organomegaly Genital (male): Deferred Rectal: Deferred Musculoskeletal:  symmetrical muscle groups noted without atrophy or deformity Extremities:  no clubbing, cyanosis, or edema, no deformities, no skin discoloration Neuro:  gait normal; deep tendon reflexes normal and symmetric  Psych: well oriented with normal range of affect and appropriate judgment/insight  Assessment and Plan  Well adult exam - Plan: CBC, Comprehensive metabolic panel, Lipid panel   Well 38 y.o. male. Counseled on diet and exercise. Advanced directive form requested today.  He has a skin lesion on his anterior chest.  He  states it is unchanged over the past several years.  It feels like a skin tag.  Offered to remove it which he politely declined at this time.  He will let me know if anything changes. Self testicular exams recommended at least monthly.  Other orders as above. Follow up in 6 mo pending the above workup. The patient voiced understanding and agreement to the plan.  Jilda Roche [place], DO 09/07/23 8:33 AM

## 2023-09-07 NOTE — Patient Instructions (Addendum)
 Give [place] 2-3 business days to get the results of your labs back.   Keep the diet clean and stay active.  Please get me a copy of your advanced directive form at your convenience.   Do monthly self testicular checks in the shower. You are feeling for lumps/bumps that don't belong. If you feel anything like this, let me know!  If the lesion on your chest changes or if you simply want it off or know what it is, let me know and we can take of it.   Let us know if you need anything.

## 2023-09-24 ENCOUNTER — Encounter: Payer: Self-pay | Admitting: Cardiology

## 2023-09-24 ENCOUNTER — Ambulatory Visit: Attending: Cardiology | Admitting: Cardiology

## 2023-09-24 VITALS — BP 98/60 | HR 50 | Ht 66.0 in | Wt 176.0 lb

## 2023-09-24 DIAGNOSIS — I493 Ventricular premature depolarization: Secondary | ICD-10-CM | POA: Diagnosis not present

## 2023-09-24 DIAGNOSIS — E7800 Pure hypercholesterolemia, unspecified: Secondary | ICD-10-CM | POA: Diagnosis not present

## 2023-09-24 NOTE — Patient Instructions (Signed)

## 2023-09-24 NOTE — Progress Notes (Signed)
 Cardiology Office Note:    Date:  09/24/2023   ID:  Jeffery Boyd, DOB 06-29-1985, MRN [id number]  PCP:  Sharlene Dory, DO  Cardiologist:  Garwin Brothers, MD   Referring MD: Sharlene Dory*    ASSESSMENT:    1. Pure hypercholesterolemia   2. Ventricular premature depolarization    PLAN:    In order of problems listed above:  Primary prevention stressed with the patient.  Importance of compliance with diet medication stressed and patient verbalized standing. PVCs: These have resolved and the patient does not have any symptoms and is happy about it. Mixed dyslipidemia: On lipid-lowering medications followed by primary care.  I reviewed lipids and discussed with him at length and they are fine and he is happy about it.  He was advised to at least walk or run half an hour a day on a daily basis and he promises to do so.  He will be seen in follow-up appointment on annual basis.   Medication Adjustments/Labs and Tests Ordered: Current medicines are reviewed at length with the patient today.  Concerns regarding medicines are outlined above.  Orders Placed This Encounter  Procedures   EKG 12-Lead   No orders of the defined types were placed in this encounter.    No chief complaint on file.    History of Present Illness:    Jeffery Boyd is a 38 y.o. male.  Patient has past medical history of PVCs and familial hyperlipidemia.  He denies any problems at this time and takes care of activities of daily living.  He leads an active lifestyle but does not exercise on a regular basis.  At the time of my evaluation, the patient is alert awake oriented and in no distress.  Past Medical History:  Diagnosis Date   Chronic wrist pain, left 06/24/2021   Contusion of bone 03/19/2020   Extensor carpi ulnaris tendinitis 07/02/2021   Hyperlipidemia 05/26/2018   Pure hypercholesterolemia 12/14/2020   Ventricular premature depolarization 05/26/2018   On Atenolol    Past  Surgical History:  Procedure Laterality Date   WISDOM TOOTH EXTRACTION      Current Medications: Current Meds  Medication Sig   atenolol (TENORMIN) 25 MG tablet Take 1 tablet (25 mg total) by mouth daily.   rosuvastatin (CRESTOR) 20 MG tablet Take 1 tablet (20 mg total) by mouth daily.   valACYclovir (VALTREX) 1000 MG tablet Take 2 tabs and repeat in 12 hrs for cold sores.     Allergies:   Patient has no known allergies.   Social History   Socioeconomic History   Marital status: Married    Spouse name: Not on file   Number of children: Not on file   Years of education: Not on file   Highest education level: Not on file  Occupational History   Not on file  Tobacco Use   Smoking status: Never    Passive exposure: Never   Smokeless tobacco: Never  Vaping Use   Vaping status: Never Used  Substance and Sexual Activity   Alcohol use: Yes    Comment: occ   Drug use: Not Currently   Sexual activity: Not on file  Other Topics Concern   Not on file  Social History Narrative   Not on file   Social Drivers of Health   Financial Resource Strain: Not on file  Food Insecurity: Not on file  Transportation Needs: Not on file  Physical Activity: Not on file  Stress: Not on  file  Social Connections: Unknown (10/28/2021)   [facility]   Social Network    Social Network: Not on file     Family History: The patient's family history includes Asthma in his mother; Heart attack (age of onset: 40) in his paternal uncle; Hyperlipidemia in his father; Hypertension in his father.  ROS:   Please see the history of present illness.    All other systems reviewed and are negative.  EKGs/Labs/Other Studies Reviewed:    The following studies were reviewed today .Marland KitchenEKG Interpretation Date/Time:  Thursday September 24 2023 16:37:14 EDT Ventricular Rate:  50 PR Interval:  132 QRS Duration:  92 QT Interval:  418 QTC Calculation: 381 R Axis:   117  Text  Interpretation: Sinus bradycardia Left posterior fascicular block No previous ECGs available Confirmed by Belva Crome [phone]) on 09/24/2023 4:49:49 PM     Recent Labs: 09/07/2023: ALT 19; BUN 18; Creatinine, Ser 1.43; Hemoglobin 15.8; Platelets 218.0; Potassium 4.3; Sodium 139  Recent Lipid Panel    Component Value Date/Time   CHOL 129 09/07/2023 0837   TRIG 68.0 09/07/2023 0837   HDL 44.90 09/07/2023 0837   CHOLHDL 3 09/07/2023 0837   VLDL 13.6 09/07/2023 0837   LDLCALC 71 09/07/2023 0837   LDLCALC 133 (H) 12/14/2020 1510    Physical Exam:    VS:  BP 98/60   Pulse (!) 50   Ht 5\' 6"  (1.676 m)   Wt 176 lb (79.8 kg)   SpO2 96%   BMI 28.41 kg/m     Wt Readings from Last 3 Encounters:  09/24/23 176 lb (79.8 kg)  09/07/23 177 lb (80.3 kg)  07/09/22 176 lb 1.3 oz (79.9 kg)     GEN: Patient is in no acute distress HEENT: Normal NECK: No JVD; No carotid bruits LYMPHATICS: No lymphadenopathy CARDIAC: Hear sounds regular, 2/6 systolic murmur at the apex. RESPIRATORY:  Clear to auscultation without rales, wheezing or rhonchi  ABDOMEN: Soft, non-tender, non-distended MUSCULOSKELETAL:  No edema; No deformity  SKIN: Warm and dry NEUROLOGIC:  Alert and oriented x 3 PSYCHIATRIC:  Normal affect   Signed, Garwin Brothers, MD  09/24/2023 4:50 PM    [facility]

## 2023-10-02 ENCOUNTER — Other Ambulatory Visit: Payer: Self-pay | Admitting: Cardiology

## 2023-10-02 NOTE — Telephone Encounter (Signed)
 Rx refill sent to pharmacy.

## 2024-02-16 ENCOUNTER — Encounter: Payer: Self-pay | Admitting: Sports Medicine

## 2024-03-07 ENCOUNTER — Other Ambulatory Visit: Payer: Self-pay | Admitting: Family Medicine

## 2024-03-07 NOTE — Telephone Encounter (Unsigned)
 Copied from CRM [id number]. Topic: Clinical - Medication Refill >> Mar 07, 2024  4:19 PM Chiquita SQUIBB wrote: Medication: rosuvastatin  rosuvastatin  (CRESTOR ) 20 MG tablet   Has the patient contacted their pharmacy? Yes- Pharmacy states they have sent it every day since 09/14, with no response from the doctor.  (Agent: If no, request that the patient contact the pharmacy for the refill. If patient does not wish to contact the pharmacy document the reason why and proceed with request.) (Agent: If yes, when and what did the pharmacy advise?)  This is the patient's preferred pharmacy:  [id number] - [place], [place] - [address] [place] [address] Phone: [phone] Fax: [phone]   Is this the correct pharmacy for this prescription? Yes If no, delete pharmacy and type the correct one.   Has the prescription been filled recently? No  Is the patient out of the medication? Yes- Patient ran out yesterday  Has the patient been seen for an appointment in the last year OR does the patient have an upcoming appointment? Yes  Can we respond through MyChart? Yes  Agent: Please be advised that Rx refills may take up to 3 business days. We ask that you follow-up with your pharmacy.

## 2024-03-08 MED ORDER — ROSUVASTATIN CALCIUM 20 MG PO TABS: 20.0000 mg | ORAL_TABLET | Freq: Every day | ORAL | 1 refills | Status: AC

## 2024-03-14 ENCOUNTER — Ambulatory Visit: Payer: Self-pay | Admitting: Family Medicine

## 2024-03-14 ENCOUNTER — Ambulatory Visit: Admitting: Family Medicine

## 2024-03-14 ENCOUNTER — Encounter: Payer: Self-pay | Admitting: Family Medicine

## 2024-03-14 ENCOUNTER — Other Ambulatory Visit: Payer: Self-pay

## 2024-03-14 VITALS — BP 110/64 | HR 68 | Temp 97.6°F | Resp 16 | Ht 66.0 in | Wt 176.4 lb

## 2024-03-14 DIAGNOSIS — E781 Pure hyperglyceridemia: Secondary | ICD-10-CM

## 2024-03-14 DIAGNOSIS — E7800 Pure hypercholesterolemia, unspecified: Secondary | ICD-10-CM

## 2024-03-14 LAB — LIPID PANEL
Cholesterol: 132 mg/dL (ref 0–200)
HDL: 39.9 mg/dL (ref >39.00)
LDL Cholesterol: 51 mg/dL (ref 0–99)
NonHDL: 92.2
Total CHOL/HDL Ratio: 3
Triglycerides: 207 mg/dL — ABNORMAL HIGH (ref 0.0–149.0)
VLDL: 41.4 mg/dL — ABNORMAL HIGH (ref 0.0–40.0)

## 2024-03-14 LAB — HEPATIC FUNCTION PANEL
ALT: 14 U/L (ref 0–53)
AST: 20 U/L (ref 0–37)
Albumin: 4.4 g/dL (ref 3.5–5.2)
Alkaline Phosphatase: 50 U/L (ref 39–117)
Bilirubin, Direct: 0.1 mg/dL (ref 0.0–0.3)
Total Bilirubin: 0.7 mg/dL (ref 0.2–1.2)
Total Protein: 6.2 g/dL (ref 6.0–8.3)

## 2024-03-14 NOTE — Patient Instructions (Addendum)
 Give us  2-3 business days to get the results of your labs back.   Keep the diet clean and stay active.  If you change your mind about seeing a dermatologist or having it removed here, please let us  know. Unless there is a change that is not easily explained, no need to pursue these for now.   Let us  know if you need anything.

## 2024-03-14 NOTE — Progress Notes (Signed)
 Chief Complaint  Patient presents with   Follow-up    Follow Up    Subjective: Hyperlipidemia Patient presents for hyperlipidemia follow up. Currently taking Crestor  20 mg daily and compliance with treatment thus far has been good. He denies myalgias. He is adhering to a healthy diet. Exercise: Calisthenics No chest pain or shortness of breath. The patient is not known to have coexisting coronary artery disease.  Past Medical History:  Diagnosis Date   Chronic wrist pain, left 06/24/2021   Contusion of bone 03/19/2020   Extensor carpi ulnaris tendinitis 07/02/2021   Hyperlipidemia 05/26/2018   Pure hypercholesterolemia 12/14/2020   Ventricular premature depolarization 05/26/2018   On Atenolol     Objective: BP 110/64 (BP Location: Left Arm, Patient Position: Sitting)   Pulse 68   Temp 97.6 F (36.4 C) (Oral)   Resp 16   Ht 5' 6 (1.676 m)   Wt 176 lb 6.4 oz (80 kg)   SpO2 97%   BMI 28.47 kg/m  General: Awake, appears stated age HEENT: MMM Heart: RRR, no LE edema, no bruits Lungs: CTAB, no rales, wheezes or rhonchi. No accessory muscle use Psych: Age appropriate judgment and insight, normal affect and mood  Assessment and Plan: Pure hypercholesterolemia - Plan: Lipid panel, Hepatic function panel  Chronic, stable.  Continue Crestor  20 mg daily.  Counseled on diet and exercise.  Check labs. F/u in 6 months for a physical. The patient voiced understanding and agreement to the plan.  Mabel [place], DO 03/14/24  8:03 AM

## 2024-04-25 ENCOUNTER — Other Ambulatory Visit

## 2024-04-25 ENCOUNTER — Ambulatory Visit: Payer: Self-pay | Admitting: Family Medicine

## 2024-04-25 DIAGNOSIS — E781 Pure hyperglyceridemia: Secondary | ICD-10-CM

## 2024-04-25 LAB — LIPID PANEL
Cholesterol: 123 mg/dL (ref 0–200)
HDL: 42.3 mg/dL (ref >39.00)
LDL Cholesterol: 67 mg/dL (ref 0–99)
NonHDL: 80.33
Total CHOL/HDL Ratio: 3
Triglycerides: 69 mg/dL (ref 0.0–149.0)
VLDL: 13.8 mg/dL (ref 0.0–40.0)

## 2024-05-06 ENCOUNTER — Encounter: Payer: Self-pay | Admitting: Family Medicine

## 2024-05-06 ENCOUNTER — Other Ambulatory Visit: Payer: Self-pay

## 2024-05-06 MED ORDER — VALACYCLOVIR HCL 1 G PO TABS: ORAL_TABLET | ORAL | 1 refills | Status: AC

## 2024-09-28 ENCOUNTER — Encounter: Admitting: Family Medicine
